# Patient Record
Sex: Female | Born: 1975
Health system: Southern US, Community
[De-identification: ages and names within clinical notes are randomized; demographics above are authoritative.]

## PROBLEM LIST (undated history)

## (undated) ENCOUNTER — Emergency Department (HOSPITAL_COMMUNITY): Payer: BLUE CROSS/BLUE SHIELD | Source: Home / Self Care

## (undated) DIAGNOSIS — F32A Depression, unspecified: Secondary | ICD-10-CM

## (undated) DIAGNOSIS — T7840XA Allergy, unspecified, initial encounter: Secondary | ICD-10-CM

## (undated) DIAGNOSIS — L255 Unspecified contact dermatitis due to plants, except food: Secondary | ICD-10-CM

## (undated) DIAGNOSIS — M25559 Pain in unspecified hip: Secondary | ICD-10-CM

## (undated) DIAGNOSIS — K219 Gastro-esophageal reflux disease without esophagitis: Secondary | ICD-10-CM

## (undated) DIAGNOSIS — F329 Major depressive disorder, single episode, unspecified: Secondary | ICD-10-CM

## (undated) DIAGNOSIS — N949 Unspecified condition associated with female genital organs and menstrual cycle: Secondary | ICD-10-CM

## (undated) DIAGNOSIS — R519 Headache, unspecified: Secondary | ICD-10-CM

## (undated) DIAGNOSIS — M545 Low back pain: Secondary | ICD-10-CM

## (undated) DIAGNOSIS — R51 Headache: Secondary | ICD-10-CM

## (undated) DIAGNOSIS — J45909 Unspecified asthma, uncomplicated: Secondary | ICD-10-CM

## (undated) DIAGNOSIS — N6320 Unspecified lump in the left breast, unspecified quadrant: Secondary | ICD-10-CM

## (undated) HISTORY — DX: Unspecified asthma, uncomplicated: J45.909

## (undated) HISTORY — DX: Depression, unspecified: F32.A

## (undated) HISTORY — PX: BREAST BIOPSY: SHX20

## (undated) HISTORY — DX: Allergy, unspecified, initial encounter: T78.40XA

## (undated) HISTORY — DX: Unspecified contact dermatitis due to plants, except food: L25.5

## (undated) HISTORY — DX: Pain in unspecified hip: M25.559

## (undated) HISTORY — PX: OTHER SURGICAL HISTORY: SHX169

## (undated) HISTORY — PX: WISDOM TOOTH EXTRACTION: SHX21

## (undated) HISTORY — PX: TONSILLECTOMY AND ADENOIDECTOMY: SUR1326

## (undated) HISTORY — DX: Major depressive disorder, single episode, unspecified: F32.9

## (undated) HISTORY — DX: Low back pain: M54.5

## (undated) HISTORY — DX: Unspecified condition associated with female genital organs and menstrual cycle: N94.9

## (undated) HISTORY — PX: ANKLE ARTHROSCOPY W/ INTERNAL FIXATION AND ILIAC CREST BONE GRAFT: SHX1144

---

## 1999-03-14 ENCOUNTER — Other Ambulatory Visit: Admission: RE | Admit: 1999-03-14 | Discharge: 1999-03-14 | Payer: Self-pay | Admitting: Family Medicine

## 1999-05-02 ENCOUNTER — Other Ambulatory Visit: Admission: RE | Admit: 1999-05-02 | Discharge: 1999-05-02 | Payer: Self-pay | Admitting: Family Medicine

## 2001-11-16 ENCOUNTER — Other Ambulatory Visit: Admission: RE | Admit: 2001-11-16 | Discharge: 2001-11-16 | Payer: Self-pay | Admitting: Family Medicine

## 2002-07-26 ENCOUNTER — Encounter: Admission: RE | Admit: 2002-07-26 | Discharge: 2002-07-26 | Payer: Self-pay | Admitting: Family Medicine

## 2002-07-26 ENCOUNTER — Encounter: Payer: Self-pay | Admitting: Family Medicine

## 2006-02-02 ENCOUNTER — Ambulatory Visit: Payer: Self-pay | Admitting: Family Medicine

## 2006-02-09 ENCOUNTER — Encounter: Payer: Self-pay | Admitting: Family Medicine

## 2006-02-09 ENCOUNTER — Ambulatory Visit: Payer: Self-pay | Admitting: Family Medicine

## 2006-02-09 ENCOUNTER — Other Ambulatory Visit: Admission: RE | Admit: 2006-02-09 | Discharge: 2006-02-09 | Payer: Self-pay | Admitting: Family Medicine

## 2006-02-17 ENCOUNTER — Ambulatory Visit: Payer: Self-pay | Admitting: Family Medicine

## 2006-04-27 ENCOUNTER — Ambulatory Visit: Payer: Self-pay | Admitting: Family Medicine

## 2006-06-15 ENCOUNTER — Ambulatory Visit: Payer: Self-pay | Admitting: Family Medicine

## 2007-03-31 DIAGNOSIS — J45909 Unspecified asthma, uncomplicated: Secondary | ICD-10-CM

## 2007-03-31 HISTORY — DX: Unspecified asthma, uncomplicated: J45.909

## 2008-01-13 ENCOUNTER — Ambulatory Visit: Payer: Self-pay | Admitting: Family Medicine

## 2008-01-13 LAB — CONVERTED CEMR LAB
ALT: 15 units/L (ref 0–35)
AST: 17 units/L (ref 0–37)
Albumin: 4.1 g/dL (ref 3.5–5.2)
Alkaline Phosphatase: 47 units/L (ref 39–117)
BUN: 10 mg/dL (ref 6–23)
Basophils Absolute: 0 10*3/uL (ref 0.0–0.1)
Basophils Relative: 0.5 % (ref 0.0–1.0)
Bilirubin Urine: NEGATIVE
Bilirubin, Direct: 0.1 mg/dL (ref 0.0–0.3)
Blood in Urine, dipstick: NEGATIVE
CO2: 25 meq/L (ref 19–32)
Calcium: 9 mg/dL (ref 8.4–10.5)
Chloride: 108 meq/L (ref 96–112)
Cholesterol: 136 mg/dL (ref 0–200)
Creatinine, Ser: 0.8 mg/dL (ref 0.4–1.2)
Eosinophils Absolute: 0.1 10*3/uL (ref 0.0–0.7)
Eosinophils Relative: 2.7 % (ref 0.0–5.0)
GFR calc Af Amer: 106 mL/min
GFR calc non Af Amer: 87 mL/min
Glucose, Bld: 88 mg/dL (ref 70–99)
Glucose, Urine, Semiquant: NEGATIVE
HCT: 39 % (ref 36.0–46.0)
HDL: 52.9 mg/dL (ref >39.0)
Hemoglobin: 13.8 g/dL (ref 12.0–15.0)
Ketones, urine, test strip: NEGATIVE
LDL Cholesterol: 71 mg/dL (ref 0–99)
Lymphocytes Relative: 30.8 % (ref 12.0–46.0)
MCHC: 35.5 g/dL (ref 30.0–36.0)
MCV: 93.7 fL (ref 78.0–100.0)
Monocytes Absolute: 0.4 10*3/uL (ref 0.1–1.0)
Monocytes Relative: 8.1 % (ref 3.0–12.0)
Neutro Abs: 3 10*3/uL (ref 1.4–7.7)
Neutrophils Relative %: 57.9 % (ref 43.0–77.0)
Nitrite: NEGATIVE
Platelets: 221 10*3/uL (ref 150–400)
Potassium: 3.9 meq/L (ref 3.5–5.1)
Protein, U semiquant: NEGATIVE
RBC: 4.16 M/uL (ref 3.87–5.11)
RDW: 12.1 % (ref 11.5–14.6)
Sodium: 140 meq/L (ref 135–145)
Specific Gravity, Urine: 1.02
TSH: 1.07 microintl units/mL (ref 0.35–5.50)
Total Bilirubin: 1.2 mg/dL (ref 0.3–1.2)
Total CHOL/HDL Ratio: 2.6
Total Protein: 6.6 g/dL (ref 6.0–8.3)
Triglycerides: 59 mg/dL (ref 0–149)
Urobilinogen, UA: 0.2
VLDL: 12 mg/dL (ref 0–40)
WBC Urine, dipstick: NEGATIVE
WBC: 5 10*3/uL (ref 4.5–10.5)
pH: 7

## 2008-01-20 ENCOUNTER — Other Ambulatory Visit: Admission: RE | Admit: 2008-01-20 | Discharge: 2008-01-20 | Payer: Self-pay | Admitting: Family Medicine

## 2008-01-20 ENCOUNTER — Ambulatory Visit: Payer: Self-pay | Admitting: Family Medicine

## 2008-01-20 ENCOUNTER — Encounter: Payer: Self-pay | Admitting: Family Medicine

## 2008-04-17 DIAGNOSIS — M545 Low back pain, unspecified: Secondary | ICD-10-CM | POA: Insufficient documentation

## 2008-04-17 HISTORY — DX: Low back pain, unspecified: M54.50

## 2008-04-18 ENCOUNTER — Ambulatory Visit: Payer: Self-pay | Admitting: Family Medicine

## 2008-05-23 ENCOUNTER — Ambulatory Visit: Payer: Self-pay | Admitting: Family Medicine

## 2008-05-25 ENCOUNTER — Ambulatory Visit: Payer: Self-pay | Admitting: Family Medicine

## 2009-01-19 ENCOUNTER — Ambulatory Visit: Payer: Self-pay | Admitting: Family Medicine

## 2009-01-19 LAB — CONVERTED CEMR LAB
ALT: 22 units/L (ref 0–35)
AST: 23 units/L (ref 0–37)
Albumin: 4.1 g/dL (ref 3.5–5.2)
Alkaline Phosphatase: 40 units/L (ref 39–117)
BUN: 10 mg/dL (ref 6–23)
Basophils Absolute: 0 10*3/uL (ref 0.0–0.1)
Basophils Relative: 0 % (ref 0.0–3.0)
Bilirubin Urine: NEGATIVE
Bilirubin, Direct: 0 mg/dL (ref 0.0–0.3)
Blood in Urine, dipstick: NEGATIVE
CO2: 28 meq/L (ref 19–32)
Calcium: 8.9 mg/dL (ref 8.4–10.5)
Chloride: 111 meq/L (ref 96–112)
Cholesterol: 134 mg/dL (ref 0–200)
Creatinine, Ser: 0.8 mg/dL (ref 0.4–1.2)
Eosinophils Absolute: 0.1 10*3/uL (ref 0.0–0.7)
Eosinophils Relative: 1.6 % (ref 0.0–5.0)
GFR calc non Af Amer: 86.54 mL/min (ref >60)
Glucose, Bld: 88 mg/dL (ref 70–99)
Glucose, Urine, Semiquant: NEGATIVE
HCT: 40 % (ref 36.0–46.0)
HDL: 48.1 mg/dL (ref >39.00)
Hemoglobin: 13.9 g/dL (ref 12.0–15.0)
Ketones, urine, test strip: NEGATIVE
LDL Cholesterol: 73 mg/dL (ref 0–99)
Lymphocytes Relative: 29.9 % (ref 12.0–46.0)
Lymphs Abs: 1.4 10*3/uL (ref 0.7–4.0)
MCHC: 34.7 g/dL (ref 30.0–36.0)
MCV: 92.9 fL (ref 78.0–100.0)
Monocytes Absolute: 0.3 10*3/uL (ref 0.1–1.0)
Monocytes Relative: 6.8 % (ref 3.0–12.0)
Neutro Abs: 2.9 10*3/uL (ref 1.4–7.7)
Neutrophils Relative %: 61.7 % (ref 43.0–77.0)
Nitrite: NEGATIVE
Platelets: 212 10*3/uL (ref 150.0–400.0)
Potassium: 4 meq/L (ref 3.5–5.1)
Protein, U semiquant: NEGATIVE
RBC: 4.31 M/uL (ref 3.87–5.11)
RDW: 11.9 % (ref 11.5–14.6)
Sodium: 139 meq/L (ref 135–145)
Specific Gravity, Urine: 1.02
TSH: 0.93 microintl units/mL (ref 0.35–5.50)
Total Bilirubin: 1.2 mg/dL (ref 0.3–1.2)
Total CHOL/HDL Ratio: 3
Total Protein: 6.8 g/dL (ref 6.0–8.3)
Triglycerides: 67 mg/dL (ref 0.0–149.0)
Urobilinogen, UA: 0.2
VLDL: 13.4 mg/dL (ref 0.0–40.0)
WBC Urine, dipstick: NEGATIVE
WBC: 4.7 10*3/uL (ref 4.5–10.5)
pH: 6

## 2009-01-22 ENCOUNTER — Ambulatory Visit: Payer: Self-pay | Admitting: Family Medicine

## 2009-01-22 ENCOUNTER — Other Ambulatory Visit: Admission: RE | Admit: 2009-01-22 | Discharge: 2009-01-22 | Payer: Self-pay | Admitting: Family Medicine

## 2009-01-22 ENCOUNTER — Encounter: Payer: Self-pay | Admitting: Family Medicine

## 2009-01-22 DIAGNOSIS — M25559 Pain in unspecified hip: Secondary | ICD-10-CM

## 2009-01-22 HISTORY — DX: Pain in unspecified hip: M25.559

## 2009-08-25 LAB — HM PAP SMEAR: HM Pap smear: NORMAL

## 2009-09-04 ENCOUNTER — Ambulatory Visit: Payer: Self-pay | Admitting: Family Medicine

## 2009-09-05 ENCOUNTER — Telehealth: Payer: Self-pay | Admitting: Family Medicine

## 2009-09-06 ENCOUNTER — Ambulatory Visit: Payer: Self-pay | Admitting: Family Medicine

## 2009-09-07 ENCOUNTER — Ambulatory Visit: Payer: Self-pay | Admitting: Family Medicine

## 2009-12-03 DIAGNOSIS — L255 Unspecified contact dermatitis due to plants, except food: Secondary | ICD-10-CM | POA: Insufficient documentation

## 2009-12-03 HISTORY — DX: Unspecified contact dermatitis due to plants, except food: L25.5

## 2009-12-10 ENCOUNTER — Ambulatory Visit: Payer: Self-pay | Admitting: Family Medicine

## 2010-01-21 ENCOUNTER — Ambulatory Visit: Payer: Self-pay | Admitting: Family Medicine

## 2010-01-21 LAB — CONVERTED CEMR LAB
ALT: 16 units/L (ref 0–35)
AST: 21 units/L (ref 0–37)
Albumin: 4.1 g/dL (ref 3.5–5.2)
Alkaline Phosphatase: 49 units/L (ref 39–117)
BUN: 14 mg/dL (ref 6–23)
Basophils Absolute: 0 10*3/uL (ref 0.0–0.1)
Basophils Relative: 0.8 % (ref 0.0–3.0)
Bilirubin Urine: NEGATIVE
Bilirubin, Direct: 0.1 mg/dL (ref 0.0–0.3)
CO2: 28 meq/L (ref 19–32)
Calcium: 9.3 mg/dL (ref 8.4–10.5)
Chloride: 112 meq/L (ref 96–112)
Cholesterol: 151 mg/dL (ref 0–200)
Creatinine, Ser: 0.8 mg/dL (ref 0.4–1.2)
Eosinophils Absolute: 0.1 10*3/uL (ref 0.0–0.7)
Eosinophils Relative: 2.2 % (ref 0.0–5.0)
GFR calc non Af Amer: 89.93 mL/min (ref >60)
Glucose, Bld: 94 mg/dL (ref 70–99)
Glucose, Urine, Semiquant: NEGATIVE
HCT: 38.1 % (ref 36.0–46.0)
HDL: 49.6 mg/dL (ref >39.00)
Hemoglobin: 13.5 g/dL (ref 12.0–15.0)
Ketones, urine, test strip: NEGATIVE
LDL Cholesterol: 89 mg/dL (ref 0–99)
Lymphocytes Relative: 33.9 % (ref 12.0–46.0)
Lymphs Abs: 1.5 10*3/uL (ref 0.7–4.0)
MCHC: 35.4 g/dL (ref 30.0–36.0)
MCV: 91.6 fL (ref 78.0–100.0)
Monocytes Absolute: 0.3 10*3/uL (ref 0.1–1.0)
Monocytes Relative: 6.9 % (ref 3.0–12.0)
Neutro Abs: 2.5 10*3/uL (ref 1.4–7.7)
Neutrophils Relative %: 56.2 % (ref 43.0–77.0)
Nitrite: NEGATIVE
Platelets: 226 10*3/uL (ref 150.0–400.0)
Potassium: 5 meq/L (ref 3.5–5.1)
Protein, U semiquant: NEGATIVE
RBC: 4.16 M/uL (ref 3.87–5.11)
RDW: 12.5 % (ref 11.5–14.6)
Sodium: 145 meq/L (ref 135–145)
Specific Gravity, Urine: >=1.03
TSH: 0.73 microintl units/mL (ref 0.35–5.50)
Total Bilirubin: 0.7 mg/dL (ref 0.3–1.2)
Total CHOL/HDL Ratio: 3
Total Protein: 6.1 g/dL (ref 6.0–8.3)
Triglycerides: 63 mg/dL (ref 0.0–149.0)
Urobilinogen, UA: 0.2
VLDL: 12.6 mg/dL (ref 0.0–40.0)
WBC Urine, dipstick: NEGATIVE
WBC: 4.4 10*3/uL — ABNORMAL LOW (ref 4.5–10.5)
pH: 5.5

## 2010-01-24 ENCOUNTER — Ambulatory Visit: Payer: Self-pay | Admitting: Family Medicine

## 2010-01-24 ENCOUNTER — Other Ambulatory Visit: Admission: RE | Admit: 2010-01-24 | Discharge: 2010-01-24 | Payer: Self-pay | Admitting: Family Medicine

## 2010-01-24 DIAGNOSIS — N949 Unspecified condition associated with female genital organs and menstrual cycle: Secondary | ICD-10-CM

## 2010-01-24 DIAGNOSIS — N925 Other specified irregular menstruation: Secondary | ICD-10-CM

## 2010-01-24 DIAGNOSIS — N938 Other specified abnormal uterine and vaginal bleeding: Secondary | ICD-10-CM

## 2010-01-24 HISTORY — DX: Other specified irregular menstruation: N92.5

## 2010-01-24 HISTORY — DX: Other specified abnormal uterine and vaginal bleeding: N93.8

## 2010-01-24 LAB — CONVERTED CEMR LAB: Pap Smear: NEGATIVE

## 2010-09-10 NOTE — Assessment & Plan Note (Signed)
Summary: poison ivy//ccm   Vital Signs:  Patient profile:   35 year old female Weight:      178 pounds BMI:     29.73 Temp:     97.9 degrees F oral BP sitting:   120 / 80  (left arm)  Vitals Entered By: Kern Reap CMA Duncan Dull) (Dec 10, 2009 12:35 PM) CC: poison ivy   CC:  poison ivy.  History of Present Illness: Brandi Sutton is a 35 year old, married female, nonsmoker, who comes in today for evaluation of contact dermatitis x 1 week.  She developed the symptoms on the right are week ago, now spread to her left arm and down to her legs.  Review of systems otherwise negative  Allergies: 1)  ! Pcn 2)  ! Wellbutrin  Past History:  Past medical, surgical, family and social histories (including risk factors) reviewed for relevance to current acute and chronic problems.  Past Medical History: Reviewed history from 05/25/2008 and no changes required. Asthma dysplastic nevi low back pain  Past Surgical History: Reviewed history from 03/31/2007 and no changes required. T/A Shoulder Surgery  Family History: Reviewed history from 03/31/2007 and no changes required. Family History Depression Family History Diabetes 1st degree relative Family History Hypertension Family History Lung cancer Family History of Colon CA 1st degree relative <60 Family History of Stroke M 1st degree relative <50  Social History: Reviewed history from 01/20/2008 and no changes required.  Occupation: Married Former Smoker Alcohol use-no Drug use-no Regular exercise-yes  Review of Systems      See HPI  Physical Exam  General:  Well-developed,well-nourished,in no acute distress; alert,appropriate and cooperative throughout examination Skin:  fairly extensive contact dermatitis involving both arms, and both legs   Problems:  Medical Problems Added: 1)  Dx of Contact Dermatitis&other Eczema Due To Plants  (ICD-692.6)  Impression & Recommendations:  Problem # 1:  CONTACT DERMATITIS&OTHER  ECZEMA DUE TO PLANTS (ICD-692.6) Assessment New  Her updated medication list for this problem includes:    Prednisone 20 Mg Tabs (Prednisone) ..... Uad  Complete Medication List: 1)  Flexeril 10 Mg Tabs (Cyclobenzaprine hcl) .... Take 1 tablet by mouth three times a day 2)  Vicodin Es 7.5-750 Mg Tabs (Hydrocodone-acetaminophen) .... Take 1 tablet by mouth three times a day 3)  Prednisone 20 Mg Tabs (Prednisone) .... Uad  Patient Instructions: 1)  begin prednisone two tabs now then two tabs daily x 3 days, one x 3 days, a half x 3 days, then half a tablet Monday, Wednesday, Friday, for a two-week taper.  Return p.r.n. Prescriptions: PREDNISONE 20 MG TABS (PREDNISONE) UAD  #40 x 1   Entered and Authorized by:   Roderick Pee MD   Signed by:   Roderick Pee MD on 12/10/2009   Method used:   [facility]        [facility]  [address] [facility]       [address]       [place], [place]  [address]       Ph: [phone] or [phone]       Fax: [phone]   RxID:   [id number]

## 2010-09-10 NOTE — Assessment & Plan Note (Signed)
Summary: cpx/pap/njr   Vital Signs:  Patient profile:   35 year old female Menstrual status:  irregular LMP:     01/12/2010 Height:      65 inches Weight:      176 pounds BMI:     29.39 Temp:     97.6 degrees F oral BP sitting:   118 / 80  (left arm) Cuff size:   regular  Vitals Entered By: Kern Reap CMA Duncan Dull) (January 24, 2010 10:13 AM) CC: cpx Is Patient Diabetic? No Pain Assessment Patient in pain? no      LMP (date): 01/12/2010     Menstrual Status irregular Enter LMP: 01/12/2010 Last PAP Result NEGATIVE FOR INTRAEPITHELIAL LESIONS OR MALIGNANCY.   CC:  cpx.  History of Present Illness: Brandi Sutton is a delightful 35 year old, married female, nonsmoker, G two, P2, who comes in for general medical examination,  She's always been in excellent, health.  She's had no chronic health problems.  She's having dysfunctional uterine bleeding.  It started back in 06-28-23 when her father-in-law died.  It's not gotten a better.  Her periods tend last around 10 to 12 days.  She has 3 days of flooding.  Review of systems negative  Allergies: 1)  ! Pcn 2)  ! Wellbutrin  Past History:  Past medical, surgical, family and social histories (including risk factors) reviewed, and no changes noted (except as noted below).  Past Medical History: Reviewed history from 05/25/2008 and no changes required. Asthma dysplastic nevi low back pain  Past Surgical History: Reviewed history from 03/31/2007 and no changes required. T/A Shoulder Surgery  Family History: Reviewed history from 03/31/2007 and no changes required. Family History Depression Family History Diabetes 1st degree relative Family History Hypertension Family History Lung cancer Family History of Colon CA 1st degree relative <60 Family History of Stroke M 1st degree relative <50  Social History: Reviewed history from 01/20/2008 and no changes required.  Occupation: Married Former Smoker Alcohol  use-no Drug use-no Regular exercise-yes  Review of Systems      See HPI  Physical Exam  General:  Well-developed,well-nourished,in no acute distress; alert,appropriate and cooperative throughout examination Head:  Normocephalic and atraumatic without obvious abnormalities. No apparent alopecia or balding. Eyes:  No corneal or conjunctival inflammation noted. EOMI. Perrla. Funduscopic exam benign, without hemorrhages, exudates or papilledema. Vision grossly normal. Ears:  External ear exam shows no significant lesions or deformities.  Otoscopic examination reveals clear canals, tympanic membranes are intact bilaterally without bulging, retraction, inflammation or discharge. Hearing is grossly normal bilaterally. Nose:  External nasal examination shows no deformity or inflammation. Nasal mucosa are pink and moist without lesions or exudates. Neck:  No deformities, masses, or tenderness noted. Chest Wall:  No deformities, masses, or tenderness noted. Breasts:  No mass, nodules, thickening, tenderness, bulging, retraction, inflamation, nipple discharge or skin changes noted.   Lungs:  Normal respiratory effort, chest expands symmetrically. Lungs are clear to auscultation, no crackles or wheezes. Heart:  Normal rate and regular rhythm. S1 and S2 normal without gallop, murmur, click, rub or other extra sounds. Abdomen:  Bowel sounds positive,abdomen soft and non-tender without masses, organomegaly or hernias noted. Genitalia:  Pelvic Exam:        External: normal female genitalia without lesions or masses        Vagina: normal without lesions or masses        Cervix: normal without lesions or masses        Adnexa: normal bimanual exam without masses  or fullness        Uterus: normal by palpation        Pap smear: performed Msk:  No deformity or scoliosis noted of thoracic or lumbar spine.   Pulses:  R and L carotid,radial,femoral,dorsalis pedis and posterior tibial pulses are full and equal  bilaterally Extremities:  No clubbing, cyanosis, edema, or deformity noted with normal full range of motion of all joints.   Neurologic:  No cranial nerve deficits noted. Station and gait are normal. Plantar reflexes are down-going bilaterally. DTRs are symmetrical throughout. Sensory, motor and coordinative functions appear intact. Skin:  Intact without suspicious lesions or rashes Cervical Nodes:  No lymphadenopathy noted Axillary Nodes:  No palpable lymphadenopathy Inguinal Nodes:  No significant adenopathy Psych:  Cognition and judgment appear intact. Alert and cooperative with normal attention span and concentration. No apparent delusions, illusions, hallucinations   Impression & Recommendations:  Problem # 1:  PHYSICAL EXAMINATION (ICD-V70.0) Assessment Unchanged  Problem # 2:  DYSFUNCTIONAL UTERINE BLEEDING (ICD-626.8) Assessment: New  Complete Medication List: 1)  Seasonale 0.15-0.03 Mg Tabs (Levonorgest-eth estrad 91-day) .... Uad  Patient Instructions: 1)  begin Seasonale............ one nightly x 3 months, then stop and see how your periods do,  if you have a problem  Call me.............. if not will see u  next June 2)  take an 81 mg, baby aspirin with the BCPs Prescriptions: SEASONALE 0.15-0.03 MG TABS (LEVONORGEST-ETH ESTRAD 91-DAY) UAD  #1 pak x 3   Entered and Authorized by:   Roderick Pee MD   Signed by:   Roderick Pee MD on 01/24/2010   Method used:   [facility]  [phone]       [address]       [place], [place]  [id number]       Ph: [phone] or [phone]       Fax: [phone]   RxID:   [id number]

## 2010-09-10 NOTE — Assessment & Plan Note (Signed)
Summary: SEVERE LOWER BACK PAIN/HIP AND GROIN PAIN/CJR   Vital Signs:  Patient profile:   35 year old female Weight:      176 pounds Temp:     97.7 degrees F oral BP sitting:   116 / 76  (left arm) Cuff size:   regular  Vitals Entered By: Kern Reap CMA Duncan Dull) (September 04, 2009 12:14 PM)  Reason for Visit back pain  History of Present Illness: Brandi Sutton is a 35 year old, married female, nonsmoker, who comes in today for evaluation of back pain.  She states she did not do anything unusual.  Over the weekend.  On Monday morning.  She awoke with right lumbar back pain.  She went to work, but by noon the pain was intolerable therefore, she went home and stated bedrest.  It was better.  This morning, so she went to work at noon today.  It was again intolerable.  Her pain now as a 6 on a scale of one to 10.  She states the pain is, constant, it's mostly dull, however, occasionally it will be sharp.  She points to the right lumbar area as the source of her pain.  It radiates down to the buttocks, but no further.  This is her third episode.  The last episode was about a year ago.  He was treated with bed rest, anti-inflammatories, and physical therapy, and she improved after a couple weeks.  Review of systems negative.  LMP just ended birth control husband has had vasectomy  Allergies: 1)  ! Pcn 2)  ! Wellbutrin  Past History:  Past medical, surgical, family and social histories (including risk factors) reviewed for relevance to current acute and chronic problems.  Past Medical History: Reviewed history from 05/25/2008 and no changes required. Asthma dysplastic nevi low back pain  Past Surgical History: Reviewed history from 03/31/2007 and no changes required. T/A Shoulder Surgery  Family History: Reviewed history from 03/31/2007 and no changes required. Family History Depression Family History Diabetes 1st degree relative Family History Hypertension Family History Lung  cancer Family History of Colon CA 1st degree relative <60 Family History of Stroke M 1st degree relative <50  Social History: Reviewed history from 01/20/2008 and no changes required.  Occupation: Married Former Smoker Alcohol use-no Drug use-no Regular exercise-yes  Review of Systems      See HPI  Physical Exam  General:  Well-developed,well-nourished,in no acute distress; alert,appropriate and cooperative throughout examination Msk:  No deformity or scoliosis noted of thoracic or lumbar spine.   Pulses:  R and L carotid,radial,femoral,dorsalis pedis and posterior tibial pulses are full and equal bilaterally Extremities:  No clubbing, cyanosis, edema, or deformity noted with normal full range of motion of all joints.   Neurologic:  No cranial nerve deficits noted. Station and gait are normal. Plantar reflexes are down-going bilaterally. DTRs are symmetrical throughout. Sensory, motor and coordinative functions appear intact.   Impression & Recommendations:  Problem # 1:  LOW BACK PAIN, ACUTE (ICD-724.2) Assessment Deteriorated  Her updated medication list for this problem includes:    Flexeril 10 Mg Tabs (Cyclobenzaprine hcl) .Marland Kitchen... Take 1 tablet by mouth three times a day    Vicodin Es 7.5-750 Mg Tabs (Hydrocodone-acetaminophen) .Marland Kitchen... Take 1 tablet by mouth three times a day  Complete Medication List: 1)  Flexeril 10 Mg Tabs (Cyclobenzaprine hcl) .... Take 1 tablet by mouth three times a day 2)  Vicodin Es 7.5-750 Mg Tabs (Hydrocodone-acetaminophen) .... Take 1 tablet by mouth three times a  day  Other Orders: T-Lumbar Spine w/Flex & Ext 4 Views ([id number])  Patient Instructions: 1)  begin Motrin 600 mg 3 times a day with food.  Also, Flexeril, and Vicodin, one of each 3 times a day for pain.  State, complete bedrest at home today and tomorrow.  Thursday ambulate walk lie down or lie down due to stretching return Friday afternoon for follow-up Prescriptions: VICODIN ES  7.5-750 MG TABS (HYDROCODONE-ACETAMINOPHEN) Take 1 tablet by mouth three times a day  #50 x 1   Entered and Authorized by:   Roderick Pee MD   Signed by:   Roderick Pee MD on 09/04/2009   Method used:   Print then Give to Patient   RxID:   [id number] FLEXERIL 10 MG TABS (CYCLOBENZAPRINE HCL) Take 1 tablet by mouth three times a day  #50 x 1   Entered and Authorized by:   Roderick Pee MD   Signed by:   Roderick Pee MD on 09/04/2009   Method used:   Print then Give to Patient   RxID:   [id number]

## 2010-09-10 NOTE — Assessment & Plan Note (Signed)
Summary: FUP PER DR TODD//CCM   Vital Signs:  Patient profile:   35 year old female Weight:      176 pounds Temp:     98.0 degrees F oral BP sitting:   120 / 80  (left arm) Cuff size:   regular  Vitals Entered By: Kern Reap CMA Duncan Dull) (September 07, 2009 1:56 PM)  Reason for Visit follow up back  History of Present Illness: Brandi Sutton is a 35 year old female, who comes in today for reevaluation of low back pain.  She's been on Aleve b.i.d. stretching exercises Flexeril and Vicodin nightly.  Plain films of her spine were normal.  Family history is pertinent in her mother has degenerative disk disease has had 3 lumbar surgical procedures and now has effusion with chronic pain.  She wonders if it can be a genetic problem.  Today, her pain is a two on a scale of one to 10.  Again, no radiation.  No neurologic symptoms.  She's only taken half of a Vicodin nightly p.r.n.  She's not taking the Flexeril.  She is taking the Aleve and doing the stretching exercises  Allergies: 1)  ! Pcn 2)  ! Wellbutrin  Past History:  Past medical, surgical, family and social histories (including risk factors) reviewed for relevance to current acute and chronic problems.  Past Medical History: Reviewed history from 05/25/2008 and no changes required. Asthma dysplastic nevi low back pain  Past Surgical History: Reviewed history from 03/31/2007 and no changes required. T/A Shoulder Surgery  Family History: Reviewed history from 03/31/2007 and no changes required. Family History Depression Family History Diabetes 1st degree relative Family History Hypertension Family History Lung cancer Family History of Colon CA 1st degree relative <60 Family History of Stroke M 1st degree relative <50  Social History: Reviewed history from 01/20/2008 and no changes required.  Occupation: Married Former Smoker Alcohol use-no Drug use-no Regular exercise-yes  Review of Systems      See  HPI  Physical Exam  General:  Well-developed,well-nourished,in no acute distress; alert,appropriate and cooperative throughout examination Msk:  No deformity or scoliosis noted of thoracic or lumbar spine.   Pulses:  R and L carotid,radial,femoral,dorsalis pedis and posterior tibial pulses are full and equal bilaterally Extremities:  No clubbing, cyanosis, edema, or deformity noted with normal full range of motion of all joints.   Neurologic:  No cranial nerve deficits noted. Station and gait are normal. Plantar reflexes are down-going bilaterally. DTRs are symmetrical throughout. Sensory, motor and coordinative functions appear intact.   Impression & Recommendations:  Problem # 1:  LOW BACK PAIN, ACUTE (ICD-724.2) Assessment Improved  Her updated medication list for this problem includes:    Flexeril 10 Mg Tabs (Cyclobenzaprine hcl) .Marland Kitchen... Take 1 tablet by mouth three times a day    Vicodin Es 7.5-750 Mg Tabs (Hydrocodone-acetaminophen) .Marland Kitchen... Take 1 tablet by mouth three times a day  Complete Medication List: 1)  Flexeril 10 Mg Tabs (Cyclobenzaprine hcl) .... Take 1 tablet by mouth three times a day 2)  Vicodin Es 7.5-750 Mg Tabs (Hydrocodone-acetaminophen) .... Take 1 tablet by mouth three times a day  Patient Instructions: 1)  continued conservative therapy.  If you don't see any improvement in the next week to 10 days or your symptoms get worse we would consider further consultation with neurosurgery

## 2010-09-10 NOTE — Letter (Signed)
Summary: Accidental Injury Claim Form  Accidental Injury Claim Form   Imported By: Maryln Gottron 09/26/2009 13:05:15  _____________________________________________________________________  External Attachment:    Type:   Image     Comment:   External Document

## 2010-09-10 NOTE — Progress Notes (Signed)
Summary: Pt req cpx dates from 01-13-2003 to Jan 12, 2005. Also dx codes from yesterday  Phone Note Call from Patient Call back at Home Phone [phone] Call back at [phone] cell   Caller: Patient Summary of Call: Pt called and is req dates for cpx from January 13, 2003 to 01-12-05.   Pt needs this for insurance purposes. Pt also need the dx codes from her ov yesterday re: back injury.  Initial call taken by: Lucy Antigua,  September 05, 2009 3:40 PM  Follow-up for Phone Call        ok  Follow-up by: Roderick Pee MD,  September 06, 2009 8:25 AM  Additional Follow-up for Phone Call Additional follow up Details #1::        form faxed Additional Follow-up by: Alfred Levins, CMA,  September 06, 2009 5:04 PM

## 2011-03-20 ENCOUNTER — Other Ambulatory Visit (INDEPENDENT_AMBULATORY_CARE_PROVIDER_SITE_OTHER): Payer: BC Managed Care – PPO

## 2011-03-20 DIAGNOSIS — Z0000 Encounter for general adult medical examination without abnormal findings: Secondary | ICD-10-CM

## 2011-03-20 LAB — HEPATIC FUNCTION PANEL
ALT: 28 U/L (ref 0–35)
AST: 28 U/L (ref 0–37)
Bilirubin, Direct: 0 mg/dL (ref 0.0–0.3)
Total Bilirubin: 0.5 mg/dL (ref 0.3–1.2)

## 2011-03-20 LAB — BASIC METABOLIC PANEL
BUN: 10 mg/dL (ref 6–23)
Calcium: 9.2 mg/dL (ref 8.4–10.5)
Creatinine, Ser: 1 mg/dL (ref 0.4–1.2)
GFR: 63.17 mL/min (ref >60.00)

## 2011-03-20 LAB — LIPID PANEL
LDL Cholesterol: 94 mg/dL (ref 0–99)
Total CHOL/HDL Ratio: 3
Triglycerides: 123 mg/dL (ref 0.0–149.0)
VLDL: 24.6 mg/dL (ref 0.0–40.0)

## 2011-03-20 LAB — POCT URINALYSIS DIPSTICK
Blood, UA: NEGATIVE
Glucose, UA: NEGATIVE
Nitrite, UA: NEGATIVE
Protein, UA: NEGATIVE
Urobilinogen, UA: 0.2

## 2011-03-20 LAB — CBC WITH DIFFERENTIAL/PLATELET
Eosinophils Relative: 2.9 % (ref 0.0–5.0)
Monocytes Relative: 6 % (ref 3.0–12.0)
Neutrophils Relative %: 61.6 % (ref 43.0–77.0)
Platelets: 263 10*3/uL (ref 150.0–400.0)
WBC: 5.2 10*3/uL (ref 4.5–10.5)

## 2011-03-26 ENCOUNTER — Encounter: Payer: Self-pay | Admitting: Family Medicine

## 2011-03-26 ENCOUNTER — Encounter: Payer: Self-pay | Admitting: *Deleted

## 2011-03-27 ENCOUNTER — Ambulatory Visit (INDEPENDENT_AMBULATORY_CARE_PROVIDER_SITE_OTHER): Payer: BC Managed Care – PPO | Admitting: Family Medicine

## 2011-03-27 ENCOUNTER — Encounter: Payer: Self-pay | Admitting: Family Medicine

## 2011-03-27 ENCOUNTER — Other Ambulatory Visit (HOSPITAL_COMMUNITY)
Admission: RE | Admit: 2011-03-27 | Discharge: 2011-03-27 | Disposition: A | Discharge status: Home / Self Care | Payer: BC Managed Care – PPO | Source: Ambulatory Visit | Attending: Family Medicine | Admitting: Family Medicine

## 2011-03-27 DIAGNOSIS — N949 Unspecified condition associated with female genital organs and menstrual cycle: Secondary | ICD-10-CM

## 2011-03-27 DIAGNOSIS — Z01419 Encounter for gynecological examination (general) (routine) without abnormal findings: Secondary | ICD-10-CM | POA: Insufficient documentation

## 2011-03-27 DIAGNOSIS — Z0000 Encounter for general adult medical examination without abnormal findings: Secondary | ICD-10-CM

## 2011-03-27 NOTE — Progress Notes (Signed)
  Subjective:    Patient ID: Brandi Sutton, female    DOB: 15-Apr-1976, 35 y.o.   MRN: [id number]  HPI [name] is a delightful 35 year old, married female, G1, P1,,,,,,, husband has had a vasectomy,,,,,,,, who comes in today for general medical examination,  She's always been in excellent, health.  She's had no chronic health problems except for some dysfunction uterine bleeding.  Will put on birth control pills for that and that seemed to help.  She is content to stay off of them for now.  She gets routine eye care, dental care, does not check her breasts monthly, and declines a screening mammogram.  At this point.  She would like to wait, to she's 38 because she is very tender breasts.  Both the mother and father had melanoma and we would do a complete head to toe.  Skin exam.   Review of Systems  Constitutional: Negative.   HENT: Negative.   Eyes: Negative.   Respiratory: Negative.   Cardiovascular: Negative.   Gastrointestinal: Negative.   Genitourinary: Negative.   Musculoskeletal: Negative.   Neurological: Negative.   Hematological: Negative.   Psychiatric/Behavioral: Negative.        Objective:   Physical Exam  Constitutional: She appears well-developed and well-nourished.  HENT:  Head: Normocephalic and atraumatic.  Right Ear: External ear normal.  Left Ear: External ear normal.  Nose: Nose normal.  Mouth/Throat: Oropharynx is clear and moist.  Eyes: EOM are normal. Pupils are equal, round, and reactive to light.  Neck: Normal range of motion. Neck supple. No thyromegaly present.  Cardiovascular: Normal rate, regular rhythm, normal heart sounds and intact distal pulses.  Exam reveals no gallop and no friction rub.   No murmur heard. Pulmonary/Chest: Effort normal and breath sounds normal.  Abdominal: Soft. Bowel sounds are normal. She exhibits no distension and no mass. There is no tenderness. There is no rebound.  Genitourinary: Vagina normal and uterus normal.  Guaiac negative stool. No vaginal discharge found.       Total body skin exam no abnormal appearing lesion  Musculoskeletal: Normal range of motion.  Lymphadenopathy:    She has no cervical adenopathy.  Neurological: She is alert. She has normal reflexes. No cranial nerve deficit. She exhibits normal muscle tone. Coordination normal.  Skin: Skin is warm and dry.  Psychiatric: She has a normal mood and affect. Her behavior is normal. Judgment and thought content normal.          Assessment & Plan:  Healthy female.  Dysfunction uterine bleeding, currently asymptomatic.  Yearly follow-up monthly.  Skin exam and breast exam

## 2011-03-27 NOTE — Patient Instructions (Signed)
Do a thorough breast and skin exam monthly.  Return in one year, sooner if any problems.  Remembered where your sunscreens SPF 50

## 2011-04-28 ENCOUNTER — Other Ambulatory Visit: Payer: Self-pay | Admitting: Family Medicine

## 2011-04-28 MED ORDER — CYCLOBENZAPRINE HCL 5 MG PO TABS: 5.0000 mg | ORAL_TABLET | Freq: Three times a day (TID) | ORAL | Status: DC | PRN

## 2011-04-28 MED ORDER — HYDROCODONE-ACETAMINOPHEN 7.5-750 MG PO TABS: 1.0000 | ORAL_TABLET | Freq: Four times a day (QID) | ORAL | Status: AC | PRN

## 2011-04-28 NOTE — Telephone Encounter (Signed)
Pt is having back pain requesting refill on vicodin call into [facility] [address] [phone]. Pt decline ov. Pt was last seen on 03-2011

## 2011-04-28 NOTE — Telephone Encounter (Signed)
rx called/sent and patient is aware

## 2011-04-28 NOTE — Telephone Encounter (Signed)
Flexeril 5 mg, number 30 directions one half to one tab at bedtime, refills x 1.  Vicodin ES, number 30 directions one half to one tab nightly p.r.n. Pain, refills x 1.  Motrin 600 mg twice daily with food.  If pain persists longer than a week or she has any neurologic symptoms, numbness, weakness, etc., then we need to see her in the office

## 2012-04-16 ENCOUNTER — Other Ambulatory Visit (INDEPENDENT_AMBULATORY_CARE_PROVIDER_SITE_OTHER): Payer: BC Managed Care – PPO

## 2012-04-16 DIAGNOSIS — Z0000 Encounter for general adult medical examination without abnormal findings: Secondary | ICD-10-CM

## 2012-04-16 LAB — POCT URINALYSIS DIPSTICK
Bilirubin, UA: NEGATIVE
Nitrite, UA: NEGATIVE
Protein, UA: NEGATIVE
pH, UA: 6

## 2012-04-16 LAB — CBC WITH DIFFERENTIAL/PLATELET
Basophils Relative: 0.6 % (ref 0.0–3.0)
Eosinophils Relative: 3.6 % (ref 0.0–5.0)
HCT: 41.1 % (ref 36.0–46.0)
Hemoglobin: 13.8 g/dL (ref 12.0–15.0)
Lymphs Abs: 1.3 10*3/uL (ref 0.7–4.0)
MCV: 92.3 fl (ref 78.0–100.0)
Monocytes Absolute: 0.3 10*3/uL (ref 0.1–1.0)
Monocytes Relative: 7 % (ref 3.0–12.0)
Neutro Abs: 2.7 10*3/uL (ref 1.4–7.7)
RBC: 4.45 Mil/uL (ref 3.87–5.11)
WBC: 4.4 10*3/uL — ABNORMAL LOW (ref 4.5–10.5)

## 2012-04-16 LAB — BASIC METABOLIC PANEL
BUN: 8 mg/dL (ref 6–23)
Chloride: 110 mEq/L (ref 96–112)
GFR: 85.02 mL/min (ref >60.00)
Potassium: 4.8 mEq/L (ref 3.5–5.1)
Sodium: 143 mEq/L (ref 135–145)

## 2012-04-16 LAB — HEPATIC FUNCTION PANEL
ALT: 64 U/L — ABNORMAL HIGH (ref 0–35)
AST: 39 U/L — ABNORMAL HIGH (ref 0–37)
Total Bilirubin: 0.4 mg/dL (ref 0.3–1.2)
Total Protein: 6.8 g/dL (ref 6.0–8.3)

## 2012-04-16 LAB — TSH: TSH: 0.93 u[IU]/mL (ref 0.35–5.50)

## 2012-04-16 LAB — LIPID PANEL
HDL: 47 mg/dL (ref >39.00)
Total CHOL/HDL Ratio: 3
VLDL: 23.2 mg/dL (ref 0.0–40.0)

## 2012-04-26 ENCOUNTER — Encounter: Payer: Self-pay | Admitting: Family Medicine

## 2012-04-26 ENCOUNTER — Ambulatory Visit (INDEPENDENT_AMBULATORY_CARE_PROVIDER_SITE_OTHER): Payer: BC Managed Care – PPO | Admitting: Family Medicine

## 2012-04-26 ENCOUNTER — Other Ambulatory Visit (HOSPITAL_COMMUNITY)
Admission: RE | Admit: 2012-04-26 | Discharge: 2012-04-26 | Disposition: A | Discharge status: Home / Self Care | Payer: BC Managed Care – PPO | Source: Ambulatory Visit | Attending: Family Medicine | Admitting: Family Medicine

## 2012-04-26 VITALS — BP 120/80 | Temp 98.1°F | Ht 65.75 in | Wt 189.0 lb

## 2012-04-26 DIAGNOSIS — N949 Unspecified condition associated with female genital organs and menstrual cycle: Secondary | ICD-10-CM

## 2012-04-26 DIAGNOSIS — M545 Low back pain: Secondary | ICD-10-CM

## 2012-04-26 DIAGNOSIS — Z1151 Encounter for screening for human papillomavirus (HPV): Secondary | ICD-10-CM | POA: Insufficient documentation

## 2012-04-26 DIAGNOSIS — M25559 Pain in unspecified hip: Secondary | ICD-10-CM

## 2012-04-26 DIAGNOSIS — Z01419 Encounter for gynecological examination (general) (routine) without abnormal findings: Secondary | ICD-10-CM | POA: Insufficient documentation

## 2012-04-26 DIAGNOSIS — J45909 Unspecified asthma, uncomplicated: Secondary | ICD-10-CM

## 2012-04-26 MED ORDER — TRAMADOL HCL 50 MG PO TABS: 50.0000 mg | ORAL_TABLET | Freq: Three times a day (TID) | ORAL | Status: DC | PRN

## 2012-04-26 MED ORDER — PREDNISONE 20 MG PO TABS: ORAL_TABLET | ORAL | Status: DC

## 2012-04-26 NOTE — Progress Notes (Signed)
  Subjective:    Patient ID: Brandi Sutton, female    DOB: 08/13/1975, 36 y.o.   MRN: [id number]  HPI is a 36 year old married female nonsmoker who comes in today for general physical examination  She has a history of intermittent low back pain rating down her left leg. She's been to physical therapy but does not do her exercises on a daily basis. Her weight is 189 pounds.  She smoked for 18 years quit 8 years ago. For the past 5 months she's had a cough. She's had a history of allergic rhinitis and taken Zyrtec  Her daughter Lequita Halt his 60 recently began having signs of depression with mood changes slitting her wrists. She's going to take her to a psychiatrist tomorrow    Review of Systems  Constitutional: Negative.   HENT: Negative.   Eyes: Negative.   Respiratory: Negative.   Cardiovascular: Negative.   Gastrointestinal: Negative.   Genitourinary: Negative.   Musculoskeletal: Negative.   Neurological: Negative.   Hematological: Negative.   Psychiatric/Behavioral: Negative.        Objective:   Physical Exam  Constitutional: She appears well-developed and well-nourished.  HENT:  Head: Normocephalic and atraumatic.  Right Ear: External ear normal.  Left Ear: External ear normal.  Nose: Nose normal.  Mouth/Throat: Oropharynx is clear and moist.  Eyes: EOM are normal. Pupils are equal, round, and reactive to light.  Neck: Normal range of motion. Neck supple. No thyromegaly present.  Cardiovascular: Normal rate, regular rhythm, normal heart sounds and intact distal pulses.  Exam reveals no gallop and no friction rub.   No murmur heard. Pulmonary/Chest: Effort normal and breath sounds normal.  Abdominal: Soft. Bowel sounds are normal. She exhibits no distension and no mass. There is no tenderness. There is no rebound.  Genitourinary: Vagina normal and uterus normal. No vaginal discharge found.       Bilateral breast exam normal  Musculoskeletal: Normal range of motion.    Lymphadenopathy:    She has no cervical adenopathy.  Neurological: She is alert. She has normal reflexes. No cranial nerve deficit. She exhibits normal muscle tone. Coordination normal.  Skin: Skin is warm and dry.       Total body skin exam normal  Psychiatric: She has a normal mood and affect. Her behavior is normal. Judgment and thought content normal.   symmetrical breath sounds except for mild late expiratory wheezing on forced expiration        Assessment & Plan:  Healthy female  Intermittent low back pain again prescribed daily exercises Motrin 600 mg twice a day when necessary  Cough secondary to underlying asthma,,,,,,, prednisone burst and taper

## 2012-04-26 NOTE — Patient Instructions (Signed)
For your cough start the prednisone as directed return in one week for followup  For your intermittent low back pain Motrin 600 mg twice daily for minor pain  Remember to do those exercises daily  Tramadol 1/2-1 tablet 3 times daily as needed for severe pain

## 2012-04-27 ENCOUNTER — Telehealth: Payer: Self-pay | Admitting: Family Medicine

## 2012-04-27 NOTE — Telephone Encounter (Signed)
Opened in error

## 2012-05-03 ENCOUNTER — Ambulatory Visit (INDEPENDENT_AMBULATORY_CARE_PROVIDER_SITE_OTHER): Payer: BC Managed Care – PPO | Admitting: Family Medicine

## 2012-05-03 ENCOUNTER — Encounter: Payer: Self-pay | Admitting: Family Medicine

## 2012-05-03 VITALS — BP 110/80 | Temp 98.3°F | Wt 192.0 lb

## 2012-05-03 DIAGNOSIS — J45909 Unspecified asthma, uncomplicated: Secondary | ICD-10-CM

## 2012-05-03 MED ORDER — HYDROCODONE-HOMATROPINE 5-1.5 MG/5ML PO SYRP: ORAL_SOLUTION | ORAL | Status: DC

## 2012-05-03 NOTE — Patient Instructions (Signed)
Continue 2 tabs of the prednisone daily until you feel significantly better and then begin to taper as outlined  Hydromet 1/2-1 teaspoon at bedtime when necessary for cough may repeat midnight

## 2012-05-03 NOTE — Progress Notes (Signed)
  Subjective:    Patient ID: Brandi Sutton, female    DOB: 01/04/1976, 36 y.o.   MRN: [id number]  HPI Brandi Sutton is a 36 year old married female nonsmoker who comes in today for followup of asthma  We saw her last week with a viral syndrome that triggered her asthma and started her on prednisone 40 mg a day. She states she doesn't feel better. No fever chills.   Review of Systems General and pulmonary review of systems otherwise negative    Objective:   Physical Exam  Well-developed well-nourished female no acute distress examination the lungs show symmetrical breath sounds mild late expiratory wheezing bilateral      Assessment & Plan:  Asthma resolving slowly plan continue prednisone 40 mg daily then taper slowly at Hydromet at bedtime

## 2013-06-07 ENCOUNTER — Other Ambulatory Visit: Payer: Self-pay | Admitting: Obstetrics and Gynecology

## 2013-06-14 ENCOUNTER — Other Ambulatory Visit: Payer: Self-pay | Admitting: Obstetrics and Gynecology

## 2013-07-19 ENCOUNTER — Telehealth: Payer: Self-pay | Admitting: Family Medicine

## 2013-07-19 ENCOUNTER — Ambulatory Visit (INDEPENDENT_AMBULATORY_CARE_PROVIDER_SITE_OTHER): Payer: BC Managed Care – PPO | Admitting: Family Medicine

## 2013-07-19 ENCOUNTER — Encounter: Payer: Self-pay | Admitting: Family Medicine

## 2013-07-19 VITALS — BP 130/90 | Temp 98.1°F | Wt 190.0 lb

## 2013-07-19 DIAGNOSIS — M26629 Arthralgia of temporomandibular joint, unspecified side: Secondary | ICD-10-CM | POA: Insufficient documentation

## 2013-07-19 DIAGNOSIS — J309 Allergic rhinitis, unspecified: Secondary | ICD-10-CM

## 2013-07-19 DIAGNOSIS — M2669 Other specified disorders of temporomandibular joint: Secondary | ICD-10-CM

## 2013-07-19 MED ORDER — FLUTICASONE PROPIONATE 50 MCG/ACT NA SUSP: 1.0000 | Freq: Every day | NASAL | Status: DC

## 2013-07-19 NOTE — Progress Notes (Signed)
Pre visit review using our clinic review tool, if applicable. No additional management support is needed unless otherwise documented below in the visit note. 

## 2013-07-19 NOTE — Telephone Encounter (Signed)
Severe sore throat, glands swollen,ear aches and sinus hurts. Would like to see the doc.

## 2013-07-19 NOTE — Progress Notes (Signed)
   Subjective:    Patient ID: Brandi Sutton, female    DOB: March 01, 1976, 37 y.o.   MRN: [id number]  HPI Brandi Sutton is a 37 year old married female nonsmoker who comes in today for evaluation of head congestion postnasal drip and pain in her right jaw  She's had symptoms of allergic rhinitis with head congestion postnasal drip for the last 3 days. She's also had pain in her right year. No history of trauma no fever   Review of Systems Review of systems negative    Objective:   Physical Exam  Well-developed well-nourished female no acute distress HEENT negative except for postnasal drip neck was supple no adenopathy there's point tenderness in the right TMJ consistent with TMJ syndrome      Assessment & Plan:  TMJ syndrome,,,,,,, Motrin,,,,,,,, soft diet,,,,,,,,,, mouth guard  Allergic rhinitis Zyrtec and steroid nasal spray,

## 2013-07-19 NOTE — Patient Instructions (Signed)
Steroid nasal spray,,,,, 1 spray up each nostril at bedtime  Zyrtec 10 mg plain,,,,,, 1 at bedtime  Motrin 6 mg twice daily with food  If after we you're still having pain in her right jaw then go ahead and get the mouth guard

## 2013-07-19 NOTE — Telephone Encounter (Signed)
Spoke with patient. Please schedule at 12:30 today

## 2013-09-05 ENCOUNTER — Ambulatory Visit (INDEPENDENT_AMBULATORY_CARE_PROVIDER_SITE_OTHER): Payer: BC Managed Care – PPO | Admitting: Family Medicine

## 2013-09-05 ENCOUNTER — Encounter: Payer: Self-pay | Admitting: Family Medicine

## 2013-09-05 ENCOUNTER — Ambulatory Visit (INDEPENDENT_AMBULATORY_CARE_PROVIDER_SITE_OTHER)
Admission: RE | Admit: 2013-09-05 | Discharge: 2013-09-05 | Disposition: A | Discharge status: Home / Self Care | Payer: BC Managed Care – PPO | Source: Ambulatory Visit | Attending: Family Medicine | Admitting: Family Medicine

## 2013-09-05 VITALS — BP 130/90 | Temp 98.4°F | Wt 195.0 lb

## 2013-09-05 DIAGNOSIS — J45909 Unspecified asthma, uncomplicated: Secondary | ICD-10-CM

## 2013-09-05 MED ORDER — FLUTICASONE-SALMETEROL 100-50 MCG/DOSE IN AEPB: 1.0000 | INHALATION_SPRAY | Freq: Two times a day (BID) | RESPIRATORY_TRACT | Status: DC

## 2013-09-05 MED ORDER — OMEPRAZOLE 20 MG PO CPDR: DELAYED_RELEASE_CAPSULE | ORAL | Status: DC

## 2013-09-05 NOTE — Progress Notes (Signed)
   Subjective:    Patient ID: Brandi Sutton, female    DOB: Mar 19, 1976, 38 y.o.   MRN: [id number]  HPI Brandi Sutton is a 38 year old married female nonsmoker who comes in for evaluation of a cough for year and a half  A half ago she developed a cough we saw her she was wheezing we gave her a short course of prednisone and the cough resolved. Since that time the episodes becoming more severe and more frequent. Her last round of prednisone was 8 months ago. She took the prednisone that time her cough away but as she stopped it came right back. She's tried all the antiallergy medicines at home  Also this past weekend she get some over-the-counter PPI which seem to decrease the cough however she's had no symptoms of reflux.  She's had no fever or hemoptysis weight loss etc.   Review of Systems    review of systems otherwise negative Objective:   Physical Exam  Well-developed well nourished female no acute distress vital signs stable she is afebrile HEENT negative neck was supple no adenopathy lungs are clear except for some symmetrical mild late expiratory wheezing. Cardiac exam normal      Assessment & Plan:  Persistent wheezing..........Marland Kitchen begin inhaled steroid,,,,, chest x-ray,,,,,,, anti-reflex program,,,,,,, immunology consult with Dr. Ishmael Holter

## 2013-09-05 NOTE — Patient Instructions (Signed)
Called the immunologist Dr. Levin Erp at [phone]  Advair,,,,,,,,,,,, 1 puff twice daily  Omeprazole,,,,,, one tablet before breakfast and one tablet prior to you female  Nothing new to drink for 3 hours prior to bedtime  Sleep on 2 pillows  No caffeine no alcohol or peppermint

## 2014-02-16 ENCOUNTER — Encounter: Payer: Self-pay | Admitting: Family Medicine

## 2014-02-16 ENCOUNTER — Ambulatory Visit (INDEPENDENT_AMBULATORY_CARE_PROVIDER_SITE_OTHER): Payer: BC Managed Care – PPO | Admitting: Family Medicine

## 2014-02-16 VITALS — BP 122/82 | Temp 98.3°F | Wt 172.4 lb

## 2014-02-16 DIAGNOSIS — E162 Hypoglycemia, unspecified: Secondary | ICD-10-CM

## 2014-02-16 LAB — CBC WITH DIFFERENTIAL/PLATELET
BASOS ABS: 0 10*3/uL (ref 0.0–0.1)
Basophils Relative: 0.4 % (ref 0.0–3.0)
Eosinophils Absolute: 0.1 10*3/uL (ref 0.0–0.7)
Eosinophils Relative: 1.9 % (ref 0.0–5.0)
HEMATOCRIT: 42 % (ref 36.0–46.0)
Hemoglobin: 14.2 g/dL (ref 12.0–15.0)
LYMPHS ABS: 1.6 10*3/uL (ref 0.7–4.0)
Lymphocytes Relative: 22 % (ref 12.0–46.0)
MCHC: 33.8 g/dL (ref 30.0–36.0)
MCV: 91.2 fl (ref 78.0–100.0)
MONO ABS: 0.5 10*3/uL (ref 0.1–1.0)
MONOS PCT: 6.1 % (ref 3.0–12.0)
NEUTROS PCT: 69.6 % (ref 43.0–77.0)
Neutro Abs: 5.1 10*3/uL (ref 1.4–7.7)
PLATELETS: 242 10*3/uL (ref 150.0–400.0)
RBC: 4.61 Mil/uL (ref 3.87–5.11)
RDW: 13.6 % (ref 11.5–15.5)
WBC: 7.3 10*3/uL (ref 4.0–10.5)

## 2014-02-16 LAB — HEMOGLOBIN A1C: Hgb A1c MFr Bld: 5.2 % (ref 4.6–6.5)

## 2014-02-16 LAB — BASIC METABOLIC PANEL
BUN: 12 mg/dL (ref 6–23)
CALCIUM: 9.6 mg/dL (ref 8.4–10.5)
CO2: 21 mEq/L (ref 19–32)
Chloride: 106 mEq/L (ref 96–112)
Creatinine, Ser: 0.8 mg/dL (ref 0.4–1.2)
GFR: 88.02 mL/min (ref >60.00)
Glucose, Bld: 90 mg/dL (ref 70–99)
POTASSIUM: 4.7 meq/L (ref 3.5–5.1)
SODIUM: 137 meq/L (ref 135–145)

## 2014-02-16 LAB — TSH: TSH: 0.49 u[IU]/mL (ref 0.35–4.50)

## 2014-02-16 NOTE — Progress Notes (Signed)
   Subjective:    Patient ID: Brandi Sutton, female    DOB: [birth year], 38 y.o.   MRN: [id number]  HPI Brandi Sutton is a 38 year old married female nonsmoker who comes in today for evaluation of suspected hypoglycemia  She's been not diet since the wintertime. She's lost 30 pounds. She is walking daily and avoiding carbohydrates and fats. She's had episodes of the last couple months where she feels shaky and nervous. She checked her blood sugar with a home meter but her blood sugars have been normal however she was waited a while for she checked her sugar  Review of systems otherwise negative   Review of Systems Review of systems otherwise negative    Objective:   Physical Exam  Well-developed well-nourished female no acute distress vital signs stable she is afebrile      Assessment & Plan:  Symptoms consistent with hypoglycemia.........Marland Kitchen begin workup.

## 2014-02-16 NOTE — Patient Instructions (Signed)
Continue your current diet and exercise program  If you begin to feel bad check your blood sugar immediately........... if it's below 70...........Marland Kitchen eat like butter crackers or a few sips of sugar  Return on Tuesday with a record of all your blood sugar readings

## 2014-02-21 ENCOUNTER — Ambulatory Visit (INDEPENDENT_AMBULATORY_CARE_PROVIDER_SITE_OTHER): Payer: BC Managed Care – PPO | Admitting: Family Medicine

## 2014-02-21 ENCOUNTER — Encounter: Payer: Self-pay | Admitting: Family Medicine

## 2014-02-21 VITALS — BP 110/80 | Temp 98.5°F | Wt 172.0 lb

## 2014-02-21 DIAGNOSIS — E162 Hypoglycemia, unspecified: Secondary | ICD-10-CM

## 2014-02-21 NOTE — Progress Notes (Signed)
   Subjective:    Patient ID: Brandi Sutton, female    DOB: 1976/07/22, 39 y.o.   MRN: [id number]  HPI Brandi Sutton is a 38 year old female who comes in today for followup of hypoglycemia  We saw her last week with episodes consistent with hypoglycemia. We gave her glucose monitor. She's been monitoring her blood sugar when she feels bad however blood sugars have all been normal. She states she's not had a severe episode like she had before.  All her labs were normal   Review of Systems Review of systems otherwise negative    Objective:   Physical Exam Well-developed well-nourished female no acute distress vital signs stable she is afebrile       Assessment & Plan:  Hypoglycemic episodes........... continue high protein diet....... avoid carbohydrates........ Chin consult when necessary.

## 2014-02-21 NOTE — Progress Notes (Signed)
Pre visit review using our clinic review tool, if applicable. No additional management support is needed unless otherwise documented below in the visit note. 

## 2014-09-11 ENCOUNTER — Encounter: Payer: Self-pay | Admitting: Family Medicine

## 2014-09-11 ENCOUNTER — Encounter: Payer: Self-pay | Admitting: *Deleted

## 2014-09-11 ENCOUNTER — Ambulatory Visit (INDEPENDENT_AMBULATORY_CARE_PROVIDER_SITE_OTHER): Payer: BLUE CROSS/BLUE SHIELD | Admitting: Family Medicine

## 2014-09-11 VITALS — BP 112/76 | HR 82 | Temp 97.9°F | Ht 65.75 in | Wt 165.0 lb

## 2014-09-11 DIAGNOSIS — M5441 Lumbago with sciatica, right side: Secondary | ICD-10-CM

## 2014-09-11 MED ORDER — PREDNISONE 20 MG PO TABS: ORAL_TABLET | ORAL | Status: DC

## 2014-09-11 MED ORDER — CYCLOBENZAPRINE HCL 5 MG PO TABS: 5.0000 mg | ORAL_TABLET | Freq: Three times a day (TID) | ORAL | Status: DC | PRN

## 2014-09-11 MED ORDER — HYDROCODONE-ACETAMINOPHEN 10-325 MG PO TABS: 1.0000 | ORAL_TABLET | Freq: Three times a day (TID) | ORAL | Status: DC | PRN

## 2014-09-11 NOTE — Progress Notes (Signed)
   Subjective:    Patient ID: Brandi Sutton, female    DOB: 1976-03-28, 39 y.o.   MRN: [id number]  HPI Brandi Sutton is a 39 year old married female nonsmoker who comes in today for evaluation of low back pain. She's had low back pain in the past. Her first episode was about 8 years ago when she bent over and felt a popping sensation in her back. Since that time it's been coming and going. Advise gotten better with rest and physical therapy.  8 days ago she began having a gradual onset of severe low back pain again. She says is constant a 7 on a scale of 1-10 Radius stent her mid left inner thigh. She's been taken 4 tablets of Aleve daily. The pain is increased by sitting coughing and having a bowel movement. She's been trying home PT like the she has done in the past but it hasn't helped   Review of Systems Review of systems otherwise negative    Objective:   Physical Exam  Well-developed well-nourished female no acute distress vital signs stable she's afebrile abdominal exam negative  She assumes a supine position without any discomfort. The sensation muscle strength reflexes all within normal limits. Straight leg raising negative      Assessment & Plan:  Lumbar disc disease,,,,,,,, treat symptomatically with bedrest medication follow-up in 48 hours

## 2014-09-11 NOTE — Progress Notes (Signed)
Pre visit review using our clinic review tool, if applicable. No additional management support is needed unless otherwise documented below in the visit note. 

## 2014-09-11 NOTE — Patient Instructions (Signed)
Prednisone 20 mg......... 2 tabs 3 days with a taper as outlined  Flexeril and Vicodin............ one half to one of each 3 times daily  Complete bed rest today Tuesday Wednesday  Return on Thursday for follow-up  Milk of magnesia or prune juice

## 2014-09-14 ENCOUNTER — Ambulatory Visit (INDEPENDENT_AMBULATORY_CARE_PROVIDER_SITE_OTHER): Payer: BLUE CROSS/BLUE SHIELD | Admitting: Family Medicine

## 2014-09-14 ENCOUNTER — Encounter: Payer: Self-pay | Admitting: Family Medicine

## 2014-09-14 VITALS — BP 118/78 | Temp 98.0°F | Wt 173.0 lb

## 2014-09-14 DIAGNOSIS — M5440 Lumbago with sciatica, unspecified side: Secondary | ICD-10-CM

## 2014-09-14 NOTE — Progress Notes (Signed)
   Subjective:    Patient ID: Brandi Sutton, female    DOB: 1976/01/07, 39 y.o.   MRN: [id number]  HPI Cherrish is a 39 year old female comes in today for evaluation of low back pain  We saw the first week was severe lumbar back pain. Her pain was a 7-8 on a scale of 1-10. Neurologically she was intact. We maintained her at bedrest with 40 mg of prednisone for 3 days and to taper Flexeril and Vicodin. She comes back today for follow-up sitting her pain is down to a 4. Again no neurologic symptoms  Bowel bladder function normal   Review of Systems Review of systems otherwise negative    Objective:   Physical Exam Well-developed and nourished female no acute distress vital signs stable she's afebrile neurologic exam again normal       Assessment & Plan:  Lumbar disc disease resolving with symptomatic therapy........ continue symptomatic therapy,,,,,,, back to work on Monday,,,,,,,, return when necessary,

## 2014-09-14 NOTE — Progress Notes (Signed)
Pre visit review using our clinic review tool, if applicable. No additional management support is needed unless otherwise documented below in the visit note. 

## 2014-09-14 NOTE — Patient Instructions (Signed)
Continue to taper the prednisone as outlined  Flexeril and Vicodin negative discussion  At this juncture I would recommend you get outside walk for 10 minutes, back and then lie down avoid sitting. Okay to go back to work on Monday. Take 600 mg of Motrin before you go to work along with prednisone. When you come home take a full or half of Flexeril and/or Vicodin and stay off her feet in the evening and to your pain quiets down.  Going forward weight loss and Williams exercises daily  Return when necessary

## 2014-10-05 ENCOUNTER — Telehealth: Payer: Self-pay | Admitting: Family Medicine

## 2014-10-05 NOTE — Telephone Encounter (Signed)
Pt called to say that she is still having back pain and is saying that she thinks she need an MRI. Would like a call back .

## 2014-10-23 NOTE — Telephone Encounter (Signed)
Please see

## 2014-10-24 NOTE — Telephone Encounter (Signed)
Per Dr Sherren Mocha patient should see a neurosurgeon first.  Patient would like to wait and then if no improvement she will go to a neurosurgeon.

## 2015-04-26 ENCOUNTER — Ambulatory Visit (INDEPENDENT_AMBULATORY_CARE_PROVIDER_SITE_OTHER): Payer: BLUE CROSS/BLUE SHIELD | Admitting: Family Medicine

## 2015-04-26 ENCOUNTER — Encounter: Payer: Self-pay | Admitting: Family Medicine

## 2015-04-26 VITALS — BP 120/82 | HR 75 | Temp 98.3°F | Wt 187.0 lb

## 2015-04-26 DIAGNOSIS — L237 Allergic contact dermatitis due to plants, except food: Secondary | ICD-10-CM | POA: Diagnosis not present

## 2015-04-26 DIAGNOSIS — L259 Unspecified contact dermatitis, unspecified cause: Secondary | ICD-10-CM | POA: Diagnosis not present

## 2015-04-26 MED ORDER — METHYLPREDNISOLONE ACETATE 80 MG/ML IJ SUSP
80.0000 mg | Freq: Once | INTRAMUSCULAR | Status: AC
  Administered 2015-04-26: 80 mg via INTRAMUSCULAR

## 2015-04-26 NOTE — Progress Notes (Signed)
PCP: Joycelyn Man, MD  Subjective:  Brandi Sutton is a 39 y.o. year old very pleasant female patient who presents with a rash. After contact with poision ivy while out gardening. Sunday. Rash on left arm, right foot, right ear. Was wearing shorts and flip flops. Pruritic and with vesicles. Spot in ear continues to drain from the vesicles and very irritating. Symptoms worsened for a day or so now stable.   ROS-not ill appearing, no fever/chills. No new medications. Not immunocompromised. No mucus membrane involvement.    Pertinent Past Medical History- TMJ, asthma  Medications- reviewed  Current Outpatient Prescriptions  Medication Sig Dispense Refill  . fluticasone (FLONASE) 50 MCG/ACT nasal spray Place 1 spray into both nostrils daily. 16 g 6  . omeprazole (PRILOSEC) 20 MG capsule 1 by mouth twice a day 60 capsule 3   No current facility-administered medications for this visit.    Objective: BP 120/82 mmHg  Pulse 75  Temp(Src) 98.3 F (36.8 C)  Wt 187 lb (84.823 kg) Gen: NAD, resting comfortably HEENT: Turbinates normal, TM normal. Right external ear is swollen and erythematous with clear drainage noted pooling in outer portion of ear, pharynx normal CV: RRR no murmurs rubs or gallops Lungs: CTAB no crackles, wheeze, rhonchi Ext: no edema Skin: warm, dry, no rash Also with vesicular patches on left forearm and right foot with erythematous base Neuro: grossly normal, moves all extremities  Assessment/Plan:  Contact dermatitis Due to poison ivy- patient prefers depo injection to taking course of prednisone so given in office. Return precautions advised.   Meds ordered this encounter  Medications  . methylPREDNISolone acetate (DEPO-MEDROL) injection 80 mg    Sig:

## 2015-04-26 NOTE — Patient Instructions (Signed)
Depo-medrol shot (long acting steroid to knock this poison ivy down)  Poison [employer] ivy is a rash caused by touching the leaves of the poison ivy plant. The rash often shows up 48 hours later. You might just have bumps, redness, and itching. Sometimes, blisters appear and break open. Your eyes may get puffy (swollen). Poison ivy often heals in 2 to 3 weeks without treatment. HOME CARE  If you touch poison ivy:  Wash your skin with soap and water right away. Wash under your fingernails. Do not rub the skin very hard.  Wash any clothes you were wearing.  Avoid poison ivy in the future. Poison ivy has 3 leaves on a stem.  Use medicine to help with itching as told by your doctor. Do not drive when you take this medicine.  Keep open sores dry, clean, and covered with a bandage and medicated cream, if needed.  Ask your doctor about medicine for children. GET HELP RIGHT AWAY IF:  You have open sores.  Redness spreads beyond the area of the rash.  There is yellowish white fluid (pus) coming from the rash.  Pain gets worse.  You have a temperature by mouth above 102 F (38.9 C), not controlled by medicine. MAKE SURE YOU:  Understand these instructions.  Will watch your condition.  Will get help right away if you are not doing well or get worse. Document Released: 08/30/2010 Document Revised: 10/20/2011 Document Reviewed: 08/30/2010 [facility] Patient Information 2015 [place], [place]. This information is not intended to replace advice given to you by your health care provider. Make sure you discuss any questions you have with your health care provider.

## 2015-08-12 HISTORY — PX: BREAST EXCISIONAL BIOPSY: SUR124

## 2015-10-15 ENCOUNTER — Encounter: Payer: Self-pay | Admitting: Adult Health

## 2015-10-15 ENCOUNTER — Ambulatory Visit (INDEPENDENT_AMBULATORY_CARE_PROVIDER_SITE_OTHER): Payer: BLUE CROSS/BLUE SHIELD | Admitting: Adult Health

## 2015-10-15 VITALS — BP 110/70 | Temp 98.0°F | Ht 65.75 in | Wt 190.9 lb

## 2015-10-15 DIAGNOSIS — R1011 Right upper quadrant pain: Secondary | ICD-10-CM

## 2015-10-15 DIAGNOSIS — R079 Chest pain, unspecified: Secondary | ICD-10-CM | POA: Diagnosis not present

## 2015-10-15 LAB — COMPREHENSIVE METABOLIC PANEL
ALT: 23 U/L (ref 0–35)
AST: 18 U/L (ref 0–37)
Albumin: 4.5 g/dL (ref 3.5–5.2)
Alkaline Phosphatase: 59 U/L (ref 39–117)
BUN: 7 mg/dL (ref 6–23)
CHLORIDE: 106 meq/L (ref 96–112)
CO2: 24 mEq/L (ref 19–32)
Calcium: 9.4 mg/dL (ref 8.4–10.5)
Creatinine, Ser: 0.82 mg/dL (ref 0.40–1.20)
GFR: 81.19 mL/min (ref >60.00)
GLUCOSE: 93 mg/dL (ref 70–99)
POTASSIUM: 4 meq/L (ref 3.5–5.1)
SODIUM: 138 meq/L (ref 135–145)
Total Bilirubin: 0.6 mg/dL (ref 0.2–1.2)
Total Protein: 7 g/dL (ref 6.0–8.3)

## 2015-10-15 LAB — CBC WITH DIFFERENTIAL/PLATELET
Basophils Absolute: 0.1 10*3/uL (ref 0.0–0.1)
Basophils Relative: 0.8 % (ref 0.0–3.0)
EOS ABS: 0.1 10*3/uL (ref 0.0–0.7)
Eosinophils Relative: 2 % (ref 0.0–5.0)
HCT: 42.9 % (ref 36.0–46.0)
Hemoglobin: 14.7 g/dL (ref 12.0–15.0)
LYMPHS PCT: 22.8 % (ref 12.0–46.0)
Lymphs Abs: 1.5 10*3/uL (ref 0.7–4.0)
MCHC: 34.3 g/dL (ref 30.0–36.0)
MCV: 89.7 fl (ref 78.0–100.0)
Monocytes Absolute: 0.6 10*3/uL (ref 0.1–1.0)
Monocytes Relative: 8.4 % (ref 3.0–12.0)
NEUTROS PCT: 66 % (ref 43.0–77.0)
Neutro Abs: 4.4 10*3/uL (ref 1.4–7.7)
Platelets: 269 10*3/uL (ref 150.0–400.0)
RBC: 4.78 Mil/uL (ref 3.87–5.11)
RDW: 12.7 % (ref 11.5–15.5)
WBC: 6.6 10*3/uL (ref 4.0–10.5)

## 2015-10-15 LAB — LIPASE: LIPASE: 23 U/L (ref 11.0–59.0)

## 2015-10-15 MED ORDER — OXYCODONE HCL 5 MG PO TABS: 5.0000 mg | ORAL_TABLET | ORAL | Status: DC | PRN

## 2015-10-15 MED ORDER — ONDANSETRON HCL 4 MG PO TABS: 4.0000 mg | ORAL_TABLET | Freq: Three times a day (TID) | ORAL | Status: DC | PRN

## 2015-10-15 NOTE — Patient Instructions (Addendum)
It was great meeting you today!  Your exam is consistent with cholecystitis.   I have sent in a prescription for Zofran, take this for nausea. You can take the oxycodone every 4 hours as needed for break through pain.   I will follow up with you regarding your blood work and ultrasound results.   Please let me know if you need anything.  Cholecystitis Cholecystitis is inflammation of the gallbladder. It is often called a gallbladder attack. The gallbladder is a pear-shaped organ that lies beneath the liver on the right side of the body. The gallbladder stores bile, which is a fluid that helps the body to digest fats. If bile builds up in your gallbladder, your gallbladder becomes inflamed. This condition may occur suddenly (be acute). Repeat episodes of acute cholecystitis or prolonged episodes may lead to a long-term (chronic) condition. Cholecystitis is serious and it requires treatment.  CAUSES The most common cause of this condition is gallstones. Gallstones can block the tube (duct) that carries bile out of your gallbladder. This causes bile to build up. Other causes of this condition include:  Damage to the gallbladder due to a decrease in blood flow.  Infections in the bile ducts.  Scars or kinks in the bile ducts.  Tumors in the liver, pancreas, or gallbladder. RISK FACTORS This condition is more likely to develop in:  People who have sickle cell disease.  People who take birth control pills or use estrogen.  People who have alcoholic liver disease.  People who have liver cirrhosis.  People who have their nutrition delivered through a vein (parenteral nutrition).  People who do not eat or drink (do fasting) for a long period of time.  People who are obese.  People who have rapid weight loss.  People who are pregnant.  People who have increased triglyceride levels.  People who have pancreatitis. SYMPTOMS Symptoms of this condition include:  Abdominal pain,  especially in the upper right area of the abdomen.  Abdominal tenderness or bloating.  Nausea.  Vomiting.  Fever.  Chills.  Yellowing of the skin and the whites of the eyes (jaundice). DIAGNOSIS This condition is diagnosed with a medical history and physical exam. You may also have other tests, including:  Imaging tests, such as:  An ultrasound of the gallbladder.  A CT scan of the abdomen.  A gallbladder nuclear scan (HIDA scan). This scan allows your health care provider to see the bile moving from your liver to your gallbladder and to your small intestine.  MRI.  Blood tests, such as:  A complete blood count, because the white blood cell count may be higher than normal.  Liver function tests, because some levels may be higher than normal with certain types of gallstones. TREATMENT Treatment may include:  Fasting for a certain amount of time.  IV fluids.  Medicine to treat pain or vomiting.  Antibiotic medicine.  Surgery to remove your gallbladder (cholecystectomy). This may happen immediately or at a later time. [place] care will depend on your treatment. In general:  Take over-the-counter and prescription medicines only as told by your health care provider.  If you were prescribed an antibiotic medicine, take it as told by your health care provider. Do not stop taking the antibiotic even if you start to feel better.  Follow instructions from your health care provider about what to eat or drink. When you are allowed to eat, avoid eating or drinking anything that triggers your symptoms.  Keep  all follow-up visits as told by your health care provider. This is important. SEEK MEDICAL CARE IF:  Your pain is not controlled with medicine.  You have a fever. SEEK IMMEDIATE MEDICAL CARE IF:  Your pain moves to another part of your abdomen or to your back.  You continue to have symptoms or you develop new symptoms even with treatment.    This information is not intended to replace advice given to you by your health care provider. Make sure you discuss any questions you have with your health care provider.   Document Released: 07/28/2005 Document Revised: 04/18/2015 Document Reviewed: 11/08/2014 Elsevier Interactive Patient Education [employer].

## 2015-10-15 NOTE — Addendum Note (Signed)
Addended by: Elmer Picker on: 10/15/2015 10:26 AM   Modules accepted: Orders

## 2015-10-15 NOTE — Progress Notes (Addendum)
Subjective:    Patient ID: Brandi Sutton, female    DOB: 01-12-1976, 40 y.o.   MRN: [id number]  HPI  40 year old female who presents to the office today with "gallbladder issue"s  Her symptoms started two weeks ago with indigestion.One week ago she had her first " bad attack" where she felt like " I was breathing fire". She started taking Prevacid which helped with the indigestion but did not help with the pain. Six days ago she had another attack with SOB, chest pain, and right sided abdominal pain.   Last night she had another bad attack  and felt like she should have gone to the ER. She reports that she does have nausea but has not been vomiting. She denies fevers.   She started to notice yellow stool yesterday without diarrhea. This morning she had burning, yellow diarrhea.   She eats healthy, drinks alcohol rarely.   She has a family history of non- alcoholic cirrhosis.    Review of Systems  Constitutional: Positive for activity change and appetite change. Negative for fever, chills and unexpected weight change.  Respiratory: Positive for shortness of breath. Negative for chest tightness, wheezing and stridor.   Cardiovascular: Positive for chest pain. Negative for palpitations and leg swelling.  Gastrointestinal: Positive for nausea and diarrhea. Negative for vomiting, constipation, blood in stool, abdominal distention, anal bleeding and rectal pain.  Skin: Negative.   Neurological: Negative.   All other systems reviewed and are negative.  Past Medical History  Diagnosis Date  . ASTHMA 03/31/2007  . DYSFUNCTIONAL UTERINE BLEEDING 01/24/2010  . CONTACT DERMATITIS&OTHER ECZEMA DUE TO PLANTS 12/03/2009  . HIP PAIN, RIGHT 01/22/2009  . LOW BACK PAIN, ACUTE 04/17/2008    Social History   Social History  . Marital Status: Married    Spouse Name: N/A  . Number of Children: N/A  . Years of Education: N/A   Occupational History  . Not on file.   Social History Main Topics  .  Smoking status: Former Research scientist (life sciences)  . Smokeless tobacco: Not on file  . Alcohol Use: Not on file  . Drug Use: Not on file  . Sexual Activity: Not on file   Other Topics Concern  . Not on file   Social History Narrative    Past Surgical History  Procedure Laterality Date  . Tonsillectomy and adenoidectomy    . Shoulder surgery      Family History  Problem Relation Age of Onset  . Depression Other   . Diabetes Other   . Hypertension Other   . Cancer Other     lung, colon  . Stroke Other     Allergies  Allergen Reactions  . Bupropion Hcl   . Penicillins     Current Outpatient Prescriptions on File Prior to Visit  Medication Sig Dispense Refill  . fluticasone (FLONASE) 50 MCG/ACT nasal spray Place 1 spray into both nostrils daily. 16 g 6  . omeprazole (PRILOSEC) 20 MG capsule 1 by mouth twice a day 60 capsule 3   No current facility-administered medications on file prior to visit.    BP 110/70 mmHg  Temp(Src) 98 F (36.7 C) (Oral)  Ht 5' 5.75" (1.67 m)  Wt 190 lb 14.4 oz (86.592 kg)  BMI 31.05 kg/m2       Objective:   Physical Exam  Constitutional: She is oriented to person, place, and time. She appears well-developed and well-nourished. No distress.  Cardiovascular: Normal rate, regular rhythm, normal heart  sounds and intact distal pulses.  Exam reveals no gallop and no friction rub.   No murmur heard. Pulmonary/Chest: Effort normal and breath sounds normal. No respiratory distress. She has no wheezes. She has no rales. She exhibits no tenderness.  Abdominal: Soft. Bowel sounds are normal. She exhibits no distension and no mass. There is tenderness in the right upper quadrant, right lower quadrant and epigastric area. There is no rigidity, no rebound, no guarding, no CVA tenderness, no tenderness at McBurney's point and negative Murphy's sign. No hernia.  Neurological: She is alert and oriented to person, place, and time.  Skin: Skin is warm and dry. No rash noted.  She is not diaphoretic. No erythema. No pallor.  Psychiatric: She has a normal mood and affect. Her behavior is normal. Judgment normal.  Nursing note and vitals reviewed.     Assessment & Plan:  1. Chest pain, unspecified chest pain type - EKG 12-Lead, NSR, Rate 73  2. Right upper quadrant pain - Likely gallbladder but will r/o liver as well. Possibly gastric ulcer - Lipase - US Abdomen Complete - CBC with Differential/Platelet - Comprehensive metabolic panel - Hepatic function panel - ondansetron (ZOFRAN) 4 MG tablet; Take 1 tablet (4 mg total) by mouth every 8 (eight) hours as needed for nausea or vomiting.  Dispense: 20 tablet; Refill: 0 - oxyCODONE (OXY IR/ROXICODONE) 5 MG immediate release tablet; Take 1 tablet (5 mg total) by mouth every 4 (four) hours as needed for severe pain.  Dispense: 30 tablet; Refill: 0 - Consider referral to general surgery.

## 2015-10-16 ENCOUNTER — Ambulatory Visit
Admission: RE | Admit: 2015-10-16 | Discharge: 2015-10-16 | Disposition: A | Discharge status: Home / Self Care | Payer: BLUE CROSS/BLUE SHIELD | Source: Ambulatory Visit | Attending: Adult Health | Admitting: Adult Health

## 2015-10-16 ENCOUNTER — Other Ambulatory Visit: Payer: Self-pay | Admitting: Adult Health

## 2015-10-16 ENCOUNTER — Telehealth: Payer: Self-pay | Admitting: Adult Health

## 2015-10-16 MED ORDER — PANTOPRAZOLE SODIUM 40 MG PO TBEC: 40.0000 mg | DELAYED_RELEASE_TABLET | Freq: Every day | ORAL | Status: DC

## 2015-10-16 NOTE — Telephone Encounter (Signed)
Updated patient on her [place] results. She continues to have "attacks" after she eats. Will r/o ulcer at this point. Consider CT of abdomen in the future if no improvement with switching her to Protonix.

## 2015-10-25 ENCOUNTER — Telehealth: Payer: Self-pay | Admitting: Adult Health

## 2015-10-25 ENCOUNTER — Telehealth: Payer: Self-pay | Admitting: Family Medicine

## 2015-10-25 DIAGNOSIS — R1011 Right upper quadrant pain: Secondary | ICD-10-CM

## 2015-10-25 NOTE — Telephone Encounter (Signed)
Called and left a message for pt to return call.

## 2015-10-25 NOTE — Addendum Note (Signed)
Addended by: Colleen Can on: 10/25/2015 04:05 PM   Modules accepted: Orders

## 2015-10-25 NOTE — Telephone Encounter (Signed)
Pt Ct went to clinical review informed it would take 2 business day for approval pt scheduled for tomorrow please advise / stacy wants to know if she should keep the pt scheduled or reschedul until insurance apprve test -pending insurance approval  Pt insurance is [employer] # TRACKING NUMBER FOR PT [id number] spoke with the Authorization rep on the phone with [facility] ([place]

## 2015-10-25 NOTE — Telephone Encounter (Signed)
If the protonix are not working,I will put in a referral to GI and have an CT of the abdomen done.   Let me know if this is ok with the patient.

## 2015-10-25 NOTE — Telephone Encounter (Signed)
Pt call to say she is not doing any better. Said she had a real bad attack last night. Her US showed she had ulcers. Would like a call back .   [phone]

## 2015-10-25 NOTE — Telephone Encounter (Signed)
Called and spoke with pt and pt states she has continued to have episodes periodically but she had a really bad one last night.  Per pt the pain was so bad it caused her to have an anxiety attack.  Pt is still not able to eat and has lost  4 to 5 pounds since the last time she was seen.  Pt feels like there is something pushing in front which is giving her the feeling of being "full" and that something is sitting in her chest.  Pt continues to have pain between her shoulder blades and around her side.  Pt was given nausea medication and took it due to feeling nauseous at times.  Pt denies vomiting and has not noticed a fever but she does feel clammy.  Pt can be reached at her cell phone or work phone [phone] ext. [phone]). Pls advise.

## 2015-10-25 NOTE — Telephone Encounter (Signed)
Pt Ct went to clinical review informed it would take 2 business day for approval( tracking # [id number]). pt scheduled for tomorrow, stacy want to know what should she do keep pt scheduled or reschedule her  please advise ,

## 2015-10-25 NOTE — Telephone Encounter (Signed)
Pt Ct went to clinical review informed it would take 2 business day for approval pt scheduled for tomorrow please advise / stacy wants to know if she should keep the pt scheduled or reschedul until insurance apprve test -pending insurance approval   Pt insurance is [employer] # TRACKING NUMBER FOR PT [id number]  spoke with the  Authorization rep on the phone with [facility]

## 2015-10-25 NOTE — Telephone Encounter (Signed)
Called and spoke with pt and pt is aware.  Pt would like both done.  Referrals entered.

## 2015-10-25 NOTE — Telephone Encounter (Signed)
She can wait the two days

## 2015-10-26 ENCOUNTER — Inpatient Hospital Stay: Admission: RE | Admit: 2015-10-26 | Payer: BLUE CROSS/BLUE SHIELD | Source: Ambulatory Visit

## 2015-10-26 NOTE — Telephone Encounter (Signed)
Brandi Sutton is aware.

## 2015-10-30 ENCOUNTER — Inpatient Hospital Stay: Admission: RE | Admit: 2015-10-30 | Payer: BLUE CROSS/BLUE SHIELD | Source: Ambulatory Visit

## 2015-11-08 ENCOUNTER — Ambulatory Visit (INDEPENDENT_AMBULATORY_CARE_PROVIDER_SITE_OTHER): Payer: BLUE CROSS/BLUE SHIELD | Admitting: Gastroenterology

## 2015-11-08 ENCOUNTER — Encounter: Payer: Self-pay | Admitting: Gastroenterology

## 2015-11-08 VITALS — BP 120/80 | HR 78 | Ht 66.5 in | Wt 189.0 lb

## 2015-11-08 DIAGNOSIS — R1011 Right upper quadrant pain: Secondary | ICD-10-CM | POA: Diagnosis not present

## 2015-11-08 DIAGNOSIS — R0789 Other chest pain: Secondary | ICD-10-CM

## 2015-11-08 NOTE — Progress Notes (Signed)
[facility] Consult Note:  History: Brandi Sutton 11/08/2015  Referring physician: Joycelyn Man, MD  Reason for consult/chief complaint: RUQ pain; Chest Pain; and bowel changes   Subjective HPI:  This is the initial office consult for a 40 year old woman with about a month of upper GI symptoms. It began with some dyspepsia including upset stomach and pyrosis, then progressed to frequent episodes of postprandial right upper quadrant pain that is both sharp and squeezing with radiation to the mid back. Occasionally the nonexertional chest pain will occur by itself, but usually with the right upper quadrant pain. Her bowel habits are also somewhat irregular now, she has bloating with certain foods. There's been no travel, recent antibiotic use, she does have well water at home and uses NSAIDs a few times a week. She denies nausea vomiting early satiety weight loss or rectal bleeding.   ROS:  Review of Systems  Constitutional: Negative for appetite change and unexpected weight change.  HENT: Negative for mouth sores and voice change.   Eyes: Negative for pain and redness.  Respiratory: Negative for cough and shortness of breath.   Cardiovascular: Negative for chest pain and palpitations.  Genitourinary: Negative for dysuria and hematuria.  Musculoskeletal: Negative for myalgias and arthralgias.  Skin: Negative for pallor and rash.  Neurological: Negative for weakness and headaches.  Hematological: Negative for adenopathy.     Past Medical History: Past Medical History  Diagnosis Date  . ASTHMA 03/31/2007  . DYSFUNCTIONAL UTERINE BLEEDING 01/24/2010  . CONTACT DERMATITIS&OTHER ECZEMA DUE TO PLANTS 12/03/2009  . HIP PAIN, RIGHT 01/22/2009  . LOW BACK PAIN, ACUTE 04/17/2008     Past Surgical History: Past Surgical History  Procedure Laterality Date  . Tonsillectomy and adenoidectomy       Family History: Family History  Problem Relation Age of Onset  .  Depression Mother   . Diabetes Mother     diet controlled  . Hypertension Father   . Lung cancer Paternal Grandfather   . Stroke Maternal Grandfather   . Diabetes Father     diet controlled  . Transient ischemic attack Mother   . Colon polyps Mother     pre-cancerous  . Bowel Disease Father     blockage, had to wear a bag for awhile   Mother GB out Social History: Social History   Social History  . Marital Status: Married    Spouse Name: N/A  . Number of Children: 1  . Years of Education: N/A   Occupational History  . Chartered certified accountant    Social History Main Topics  . Smoking status: Former Smoker    Types: Cigarettes    Quit date: 12/09/2005  . Smokeless tobacco: Never Used  . Alcohol Use: 0.0 oz/week    0 Standard drinks or equivalent per week     Comment: social  . Drug Use: No  . Sexual Activity: Not Asked   Other Topics Concern  . None   Social History Narrative    Allergies: Allergies  Allergen Reactions  . Bupropion Hcl   . Penicillins     Outpatient Meds: Current Outpatient Prescriptions  Medication Sig Dispense Refill  . cetirizine (ZYRTEC) 10 MG tablet Take 10 mg by mouth daily.    . fluticasone (FLONASE) 50 MCG/ACT nasal spray Place 1 spray into both nostrils daily. (Patient taking differently: Place 1 spray into both nostrils as needed. ) 16 g 6  . ondansetron (ZOFRAN) 4 MG tablet Take 1 tablet (4 mg total) by  mouth every 8 (eight) hours as needed for nausea or vomiting. 20 tablet 0  . oxyCODONE (OXY IR/ROXICODONE) 5 MG immediate release tablet Take 1 tablet (5 mg total) by mouth every 4 (four) hours as needed for severe pain. 30 tablet 0  . pantoprazole (PROTONIX) 40 MG tablet Take 1 tablet (40 mg total) by mouth daily. 30 tablet 3   No current facility-administered medications for this visit.      ___________________________________________________________________ Objective  Exam:  BP 120/80 mmHg  Pulse 78  Ht 5' 6.5" (1.689 m)   Wt 189 lb (85.73 kg)  BMI 30.05 kg/m2   General: this is a(n) Well-appearing woman with good muscle mass   Eyes: sclera anicteric, no redness  ENT: oral mucosa moist without lesions, no cervical or supraclavicular lymphadenopathy, good dentition  CV: RRR without murmur, S1/S2, no JVD, no peripheral edema  Resp: clear to auscultation bilaterally, normal RR and effort noted  GI: soft, no tenderness, with active bowel sounds. No guarding or palpable organomegaly noted.  Skin; warm and dry, no rash or jaundice noted  Neuro: awake, alert and oriented x 3. Normal gross motor function and fluent speech  Labs:  Recent normal LFTs  Radiologic Studies:  RUQ U/S nml  Assessment: Encounter Diagnoses  Name Primary?  . RUQ pain Yes  . Other chest pain     Sounds most like biliary colic, less likely peptic ulcer. She may have concomitant GERD. Bowel habit changes are also often seen with biliary colic.  Plan:  EGD.  The benefits and risks of the planned procedure were described in detail with the patient or (when appropriate) their health care proxy.  Risks were outlined as including, but not limited to, bleeding, infection, perforation, adverse medication reaction leading to cardiac or pulmonary decompensation, or pancreatitis (if ERCP).  The limitation of incomplete mucosal visualization was also discussed.  No guarantees or warranties were given.   If negative, trial of antispasmodic and surgical consult.  Thank you for the courtesy of this consult.  Please call me with any questions or concerns.  Nelida Meuse III

## 2015-11-08 NOTE — Patient Instructions (Addendum)
If you are age 42 or older, your body mass index should be between 23-30. Your Body mass index is 30.05 kg/(m^2). If this is out of the aforementioned range listed, please consider follow up with your Primary Care Provider.  If you are age 33 or younger, your body mass index should be between 19-25. Your Body mass index is 30.05 kg/(m^2). If this is out of the aformentioned range listed, please consider follow up with your Primary Care Provider.   You have been scheduled for an endoscopy. Please follow written instructions given to you at your visit today. If you use inhalers (even only as needed), please bring them with you on the day of your procedure. Your physician has requested that you go to www.startemmi.com and enter the access code given to you at your visit today. This web site gives a general overview about your procedure. However, you should still follow specific instructions given to you by our office regarding your preparation for the procedure.   Thank you for choosing [facility]  Dr Wilfrid Lund III

## 2015-11-28 ENCOUNTER — Ambulatory Visit (AMBULATORY_SURGERY_CENTER): Payer: BLUE CROSS/BLUE SHIELD | Admitting: Gastroenterology

## 2015-11-28 ENCOUNTER — Telehealth: Payer: Self-pay | Admitting: Gastroenterology

## 2015-11-28 ENCOUNTER — Encounter: Payer: Self-pay | Admitting: Gastroenterology

## 2015-11-28 VITALS — BP 104/68 | HR 60 | Temp 99.3°F | Resp 9

## 2015-11-28 DIAGNOSIS — K2990 Gastroduodenitis, unspecified, without bleeding: Secondary | ICD-10-CM

## 2015-11-28 DIAGNOSIS — R1011 Right upper quadrant pain: Secondary | ICD-10-CM

## 2015-11-28 DIAGNOSIS — K2970 Gastritis, unspecified, without bleeding: Secondary | ICD-10-CM | POA: Diagnosis not present

## 2015-11-28 MED ORDER — SODIUM CHLORIDE 0.9 % IV SOLN: 500.0000 mL | INTRAVENOUS | Status: DC

## 2015-11-28 MED ORDER — HYOSCYAMINE SULFATE 0.125 MG SL SUBL: 0.1250 mg | SUBLINGUAL_TABLET | Freq: Three times a day (TID) | SUBLINGUAL | Status: DC

## 2015-11-28 NOTE — Progress Notes (Signed)
No problems noted in the recovery room. maw 

## 2015-11-28 NOTE — Telephone Encounter (Signed)
Brandi Sutton,    Please send a referral to Dr Donne Hazel at [facility] for RUQ pain. My office consult, recent RUQ ultrasound and my EGD report can be found in Epic for them to review. Thank you.  - HD

## 2015-11-28 NOTE — Patient Instructions (Addendum)
YOU HAD AN ENDOSCOPIC PROCEDURE TODAY AT [facility]:   Refer to the procedure report that was given to you for any specific questions about what was found during the examination.  If the procedure report does not answer your questions, please call your gastroenterologist to clarify.  If you requested that your care partner not be given the details of your procedure findings, then the procedure report has been included in a sealed envelope for you to review at your convenience later.  YOU SHOULD EXPECT: Some feelings of bloating in the abdomen. Passage of more gas than usual.  Walking can help get rid of the air that was put into your GI tract during the procedure and reduce the bloating. If you had a lower endoscopy (such as a colonoscopy or flexible sigmoidoscopy) you may notice spotting of blood in your stool or on the toilet paper. If you underwent a bowel prep for your procedure, you may not have a normal bowel movement for a few days.  Please Note:  You might notice some irritation and congestion in your nose or some drainage.  This is from the oxygen used during your procedure.  There is no need for concern and it should clear up in a day or so.  SYMPTOMS TO REPORT IMMEDIATELY:    Following upper endoscopy (EGD)  Vomiting of blood or coffee ground material  New chest pain or pain under the shoulder blades  Painful or persistently difficult swallowing  New shortness of breath  Fever of 100F or higher  Black, tarry-looking stools  For urgent or emergent issues, a gastroenterologist can be reached at any hour by calling [phone].   DIET: Your first meal following the procedure should be a small meal and then it is ok to progress to your normal diet. Heavy or fried foods are harder to digest and may make you feel nauseous or bloated.  Likewise, meals heavy in dairy and vegetables can increase bloating.  Drink plenty of fluids but you should avoid alcoholic beverages  for 24 hours.  ACTIVITY:  You should plan to take it easy for the rest of today and you should NOT DRIVE or use heavy machinery until tomorrow (because of the sedation medicines used during the test).    FOLLOW UP: Our staff will call the number listed on your records the next business day following your procedure to check on you and address any questions or concerns that you may have regarding the information given to you following your procedure. If we do not reach you, we will leave a message.  However, if you are feeling well and you are not experiencing any problems, there is no need to return our call.  We will assume that you have returned to your regular daily activities without incident.  If any biopsies were taken you will be contacted by phone or by letter within the next 1-3 weeks.  Please call us at [phone] if you have not heard about the biopsies in 3 weeks.    SIGNATURES/CONFIDENTIALITY: You and/or your care partner have signed paperwork which will be entered into your electronic medical record.  These signatures attest to the fact that that the information above on your After Visit Summary has been reviewed and is understood.  Full responsibility of the confidentiality of this discharge information lies with you and/or your care-partner.    Handout was given to your care partner on gastritis. You may resume your current medications today. Rx  was sent to [facility]. For hyoscyamine take before meals. Surgical consult for possible biliary colic.  The nurse on the 3rd floor, office will set up this appointment and call you with it. Await biopsy results. Please call if any questions or concerns.

## 2015-11-28 NOTE — Op Note (Signed)
[facility] Patient Name: Brandi Sutton Procedure Date: 11/28/2015 9:15 AM MRN: [id number] Endoscopist: Mallie Mussel L. Loletha Carrow , MD Age: 40 Date of Birth: June 28, 1976 Gender: Female Procedure:                Upper GI endoscopy Indications:              Abdominal pain in the right upper quadrant Medicines:                Monitored Anesthesia Care Procedure:                Pre-Anesthesia Assessment:                           - Prior to the procedure, a History and Physical                            was performed, and patient medications and                            allergies were reviewed. The patient's tolerance of                            previous anesthesia was also reviewed. The risks                            and benefits of the procedure and the sedation                            options and risks were discussed with the patient.                            All questions were answered, and informed consent                            was obtained. Prior Anticoagulants: The patient has                            taken no previous anticoagulant or antiplatelet                            agents. ASA Grade Assessment: II - A patient with                            mild systemic disease. After reviewing the risks                            and benefits, the patient was deemed in                            satisfactory condition to undergo the procedure.                           After obtaining informed consent, the endoscope was  passed under direct vision. Throughout the                            procedure, the patient's blood pressure, pulse, and                            oxygen saturations were monitored continuously. The                            Model GIF-[id number]) scope was introduced                            through the mouth, and advanced to the second part                            of duodenum. The upper GI endoscopy was                   accomplished without difficulty. The patient                            tolerated the procedure well. Scope In: Scope Out: Findings:                 The esophagus was normal.                           Mild inflammation characterized by scant flecks of                            heme was found in the gastric antrum. Biopsies were                            taken with a cold forceps for histology.                           The examined duodenum was normal.                           The cardia and gastric fundus were normal on                            retroflexion. Complications:            No immediate complications. Estimated Blood Loss:     Estimated blood loss: none. Impression:               - Normal esophagus.                           - Gastritis. Biopsied.                           - Normal examined duodenum.                           If biopsies are negative for H. pylori, then the  endoscopic finding does not likely explain the                            patient's symptoms. Recommendation:           - Resume previous diet.                           - Trial of hyoscyamine 0.125 mg before meals                           Surgical consult for possible biliary colic                           - Continue present medications. Clara Smolen L. Loletha Carrow, MD 11/28/2015 9:44:16 AM This report has been signed electronically.

## 2015-11-28 NOTE — Progress Notes (Signed)
Called to room to assist during endoscopic procedure.  Patient ID and intended procedure confirmed with present staff. Received instructions for my participation in the procedure from the performing physician.  

## 2015-11-28 NOTE — Progress Notes (Signed)
Report to PACU, RN, vss, BBS= Clear.  

## 2015-11-29 ENCOUNTER — Telehealth: Payer: Self-pay

## 2015-11-29 NOTE — Telephone Encounter (Signed)
Left message on answering machine. 

## 2015-12-03 ENCOUNTER — Telehealth: Payer: Self-pay

## 2015-12-03 ENCOUNTER — Telehealth: Payer: Self-pay | Admitting: Gastroenterology

## 2015-12-03 NOTE — Telephone Encounter (Signed)
Did you want to sent pt to [facility] for a surgical consult or not until after biopsy results return form her recent endoscopy? Please advise. Thank you.

## 2015-12-03 NOTE — Telephone Encounter (Signed)
Please refer to general surgery now.  Thanks for checking - HD

## 2015-12-03 NOTE — Telephone Encounter (Signed)
Faxed referral to [facility] as directed by Dr Loletha Carrow. Awaiting appointment info. Pt is aware.

## 2015-12-03 NOTE — Telephone Encounter (Signed)
Pt had an EGD on 11-28-2015 Referral to surgery for biliary colic requested at that time by Dr Loletha Carrow.

## 2015-12-03 NOTE — Telephone Encounter (Signed)
Patient notified and aware of path report and the referral to [facility].

## 2015-12-04 NOTE — Telephone Encounter (Signed)
Pts appointment has been booked for 12-13-15 @ 3:40pm with Dr Chrissie Noa. Pt contacted and is aware.

## 2015-12-04 NOTE — Telephone Encounter (Signed)
Records faxed on 12-03-2015 to [facility].

## 2015-12-18 ENCOUNTER — Telehealth: Payer: Self-pay | Admitting: Family Medicine

## 2015-12-18 NOTE — Telephone Encounter (Signed)
Husband states pt is in so much pain from her gall bladder, and he does not feel she should endure this anymore. Pt comes home every day crying in pain. Pt is scheduled for surgery on 5/22 with [facility].  Pt is hoping Dr Sherren Mocha can help pt get in sooner,or what does he advise. Husband would like a call back.

## 2015-12-19 NOTE — Telephone Encounter (Signed)
Per Dr Sherren Mocha.  Patient should call surgeon.  Spoke to husband.

## 2015-12-20 ENCOUNTER — Other Ambulatory Visit: Payer: Self-pay | Admitting: General Surgery

## 2015-12-20 NOTE — Patient Instructions (Signed)
Brandi Sutton  12/20/2015   Your procedure is scheduled on: 12/25/2015    Report to [facility] Main  Entrance take [facility]  elevators to 3rd floor to  [place] at    0730 AM.  Call this number if you have problems the morning of surgery [phone]   Remember: ONLY 1 PERSON MAY GO WITH YOU TO SHORT STAY TO GET  READY MORNING OF [place].  Do not eat food or drink liquids :After Midnight.     Take these medicines the morning of surgery with A SIP OF WATER: Albuterol Inhaler if needed and bring, Zyrtec, Flonase if needed, Prevacid, Oxycodone if needed                                 You may not have any metal on your body including hair pins and              piercings  Do not wear jewelry, make-up, lotions, powders or perfumes, deodorant             Do not wear nail polish.  Do not shave  48 hours prior to surgery.             Do not bring valuables to the hospital. [place].  Contacts, dentures or bridgework may not be worn into surgery.      Patients discharged the day of surgery will not be allowed to drive home.  Name and phone number of your driver:  Special Instructions: coughing and deep breathing exercises, leg exercises               Please read over the following fact sheets you were given: _____________________________________________________________________             [facility] - Preparing for Surgery Before surgery, you can play an important role.  Because skin is not sterile, your skin needs to be as free of germs as possible.  You can reduce the number of germs on your skin by washing with CHG (chlorahexidine gluconate) soap before surgery.  CHG is an antiseptic cleaner which kills germs and bonds with the skin to continue killing germs even after washing. Please DO NOT use if you have an allergy to CHG or antibacterial soaps.  If your skin becomes reddened/irritated stop using  the CHG and inform your nurse when you arrive at Short Stay. Do not shave (including legs and underarms) for at least 48 hours prior to the first CHG shower.  You may shave your face/neck. Please follow these instructions carefully:  1.  Shower with CHG Soap the night before surgery and the  morning of Surgery.  2.  If you choose to wash your hair, wash your hair first as usual with your  normal  shampoo.  3.  After you shampoo, rinse your hair and body thoroughly to remove the  shampoo.                           4.  Use CHG as you would any other liquid soap.  You can apply chg directly  to the skin and wash  Gently with a scrungie or clean washcloth.  5.  Apply the CHG Soap to your body ONLY FROM THE NECK DOWN.   Do not use on face/ open                           Wound or open sores. Avoid contact with eyes, ears mouth and genitals (private parts).                       Wash face,  Genitals (private parts) with your normal soap.             6.  Wash thoroughly, paying special attention to the area where your surgery  will be performed.  7.  Thoroughly rinse your body with warm water from the neck down.  8.  DO NOT shower/wash with your normal soap after using and rinsing off  the CHG Soap.                9.  Pat yourself dry with a clean towel.            10.  Wear clean pajamas.            11.  Place clean sheets on your bed the night of your first shower and do not  sleep with pets. Day of Surgery : Do not apply any lotions/deodorants the morning of surgery.  Please wear clean clothes to the hospital/surgery center.  FAILURE TO FOLLOW THESE INSTRUCTIONS MAY RESULT IN THE CANCELLATION OF YOUR SURGERY PATIENT SIGNATURE_________________________________  NURSE SIGNATURE__________________________________  ________________________________________________________________________

## 2015-12-21 ENCOUNTER — Encounter (HOSPITAL_COMMUNITY): Payer: Self-pay

## 2015-12-21 ENCOUNTER — Encounter (HOSPITAL_COMMUNITY)
Admission: RE | Admit: 2015-12-21 | Discharge: 2015-12-21 | Disposition: A | Discharge status: Home / Self Care | Payer: BLUE CROSS/BLUE SHIELD | Source: Ambulatory Visit | Attending: General Surgery | Admitting: General Surgery

## 2015-12-21 DIAGNOSIS — Z01812 Encounter for preprocedural laboratory examination: Secondary | ICD-10-CM | POA: Diagnosis present

## 2015-12-21 HISTORY — DX: Headache, unspecified: R51.9

## 2015-12-21 HISTORY — DX: Gastro-esophageal reflux disease without esophagitis: K21.9

## 2015-12-21 HISTORY — DX: Headache: R51

## 2015-12-21 LAB — COMPREHENSIVE METABOLIC PANEL
ALBUMIN: 4.5 g/dL (ref 3.5–5.0)
ALK PHOS: 61 U/L (ref 38–126)
ALT: 33 U/L (ref 14–54)
AST: 27 U/L (ref 15–41)
Anion gap: 8 (ref 5–15)
BILIRUBIN TOTAL: 0.9 mg/dL (ref 0.3–1.2)
BUN: 11 mg/dL (ref 6–20)
CO2: 26 mmol/L (ref 22–32)
CREATININE: 0.73 mg/dL (ref 0.44–1.00)
Calcium: 9.3 mg/dL (ref 8.9–10.3)
Chloride: 108 mmol/L (ref 101–111)
GFR calc Af Amer: >60 mL/min (ref >60)
GLUCOSE: 99 mg/dL (ref 65–99)
Potassium: 4.1 mmol/L (ref 3.5–5.1)
Sodium: 142 mmol/L (ref 135–145)
TOTAL PROTEIN: 7.2 g/dL (ref 6.5–8.1)

## 2015-12-21 LAB — CBC WITH DIFFERENTIAL/PLATELET
BASOS ABS: 0 10*3/uL (ref 0.0–0.1)
BASOS PCT: 1 %
Eosinophils Absolute: 0.1 10*3/uL (ref 0.0–0.7)
Eosinophils Relative: 2 %
HEMATOCRIT: 43.5 % (ref 36.0–46.0)
HEMOGLOBIN: 14.7 g/dL (ref 12.0–15.0)
LYMPHS PCT: 27 %
Lymphs Abs: 1.7 10*3/uL (ref 0.7–4.0)
MCH: 30.8 pg (ref 26.0–34.0)
MCHC: 33.8 g/dL (ref 30.0–36.0)
MCV: 91.2 fL (ref 78.0–100.0)
MONO ABS: 0.5 10*3/uL (ref 0.1–1.0)
Monocytes Relative: 8 %
NEUTROS ABS: 3.9 10*3/uL (ref 1.7–7.7)
NEUTROS PCT: 62 %
Platelets: 274 10*3/uL (ref 150–400)
RBC: 4.77 MIL/uL (ref 3.87–5.11)
RDW: 12.5 % (ref 11.5–15.5)
WBC: 6.3 10*3/uL (ref 4.0–10.5)

## 2015-12-21 LAB — HCG, SERUM, QUALITATIVE: PREG SERUM: NEGATIVE

## 2015-12-21 NOTE — Progress Notes (Signed)
EKG-10/2015 in [facility]

## 2015-12-25 ENCOUNTER — Encounter (HOSPITAL_COMMUNITY)
Admission: RE | Disposition: A | Discharge status: Home / Self Care | Payer: Self-pay | Source: Ambulatory Visit | Attending: General Surgery

## 2015-12-25 ENCOUNTER — Ambulatory Visit (HOSPITAL_COMMUNITY)
Admission: RE | Admit: 2015-12-25 | Discharge: 2015-12-25 | Disposition: A | Discharge status: Home / Self Care | Payer: BLUE CROSS/BLUE SHIELD | Source: Ambulatory Visit | Attending: General Surgery | Admitting: General Surgery

## 2015-12-25 ENCOUNTER — Ambulatory Visit (HOSPITAL_COMMUNITY): Payer: BLUE CROSS/BLUE SHIELD | Admitting: Certified Registered Nurse Anesthetist

## 2015-12-25 ENCOUNTER — Encounter (HOSPITAL_COMMUNITY): Payer: Self-pay | Admitting: *Deleted

## 2015-12-25 DIAGNOSIS — Z79899 Other long term (current) drug therapy: Secondary | ICD-10-CM | POA: Insufficient documentation

## 2015-12-25 DIAGNOSIS — Z87891 Personal history of nicotine dependence: Secondary | ICD-10-CM | POA: Diagnosis not present

## 2015-12-25 DIAGNOSIS — K219 Gastro-esophageal reflux disease without esophagitis: Secondary | ICD-10-CM | POA: Insufficient documentation

## 2015-12-25 DIAGNOSIS — K811 Chronic cholecystitis: Secondary | ICD-10-CM | POA: Insufficient documentation

## 2015-12-25 DIAGNOSIS — R1011 Right upper quadrant pain: Secondary | ICD-10-CM | POA: Diagnosis present

## 2015-12-25 DIAGNOSIS — K429 Umbilical hernia without obstruction or gangrene: Secondary | ICD-10-CM | POA: Diagnosis not present

## 2015-12-25 HISTORY — PX: CHOLECYSTECTOMY: SHX55

## 2015-12-25 SURGERY — LAPAROSCOPIC CHOLECYSTECTOMY
Anesthesia: General

## 2015-12-25 MED ORDER — SUCCINYLCHOLINE CHLORIDE 20 MG/ML IJ SOLN
INTRAMUSCULAR | Status: DC | PRN
  Administered 2015-12-25: 100 mg via INTRAVENOUS

## 2015-12-25 MED ORDER — PROPOFOL 10 MG/ML IV BOLUS
INTRAVENOUS | Status: AC
  Filled 2015-12-25: qty 20

## 2015-12-25 MED ORDER — LACTATED RINGERS IV SOLN
INTRAVENOUS | Status: DC
  Administered 2015-12-25: 10:00:00 via INTRAVENOUS
  Administered 2015-12-25: 1000 mL via INTRAVENOUS

## 2015-12-25 MED ORDER — CIPROFLOXACIN IN D5W 400 MG/200ML IV SOLN
INTRAVENOUS | Status: AC
  Filled 2015-12-25: qty 200

## 2015-12-25 MED ORDER — ONDANSETRON HCL 4 MG/2ML IJ SOLN
INTRAMUSCULAR | Status: AC
  Filled 2015-12-25: qty 2

## 2015-12-25 MED ORDER — OXYCODONE-ACETAMINOPHEN 10-325 MG PO TABS: 1.0000 | ORAL_TABLET | Freq: Four times a day (QID) | ORAL | Status: DC | PRN

## 2015-12-25 MED ORDER — MIDAZOLAM HCL 2 MG/2ML IJ SOLN
INTRAMUSCULAR | Status: AC
  Filled 2015-12-25: qty 2

## 2015-12-25 MED ORDER — PROPOFOL 10 MG/ML IV BOLUS
INTRAVENOUS | Status: DC | PRN
  Administered 2015-12-25: 150 mg via INTRAVENOUS

## 2015-12-25 MED ORDER — LACTATED RINGERS IR SOLN
Status: DC | PRN
  Administered 2015-12-25: 1000 mL

## 2015-12-25 MED ORDER — MIDAZOLAM HCL 5 MG/5ML IJ SOLN
INTRAMUSCULAR | Status: DC | PRN
  Administered 2015-12-25: 2 mg via INTRAVENOUS

## 2015-12-25 MED ORDER — SODIUM CHLORIDE 0.9 % IV SOLN: INTRAVENOUS | Status: DC

## 2015-12-25 MED ORDER — CIPROFLOXACIN IN D5W 400 MG/200ML IV SOLN
400.0000 mg | INTRAVENOUS | Status: AC
  Administered 2015-12-25: 400 mg via INTRAVENOUS

## 2015-12-25 MED ORDER — FENTANYL CITRATE (PF) 250 MCG/5ML IJ SOLN
INTRAMUSCULAR | Status: AC
  Filled 2015-12-25: qty 5

## 2015-12-25 MED ORDER — OXYCODONE HCL 5 MG PO TABS: 5.0000 mg | ORAL_TABLET | ORAL | Status: DC | PRN

## 2015-12-25 MED ORDER — FENTANYL CITRATE (PF) 100 MCG/2ML IJ SOLN
INTRAMUSCULAR | Status: AC
  Filled 2015-12-25: qty 2

## 2015-12-25 MED ORDER — LIDOCAINE HCL (CARDIAC) 20 MG/ML IV SOLN
INTRAVENOUS | Status: AC
  Filled 2015-12-25: qty 5

## 2015-12-25 MED ORDER — LIDOCAINE HCL (CARDIAC) 20 MG/ML IV SOLN
INTRAVENOUS | Status: DC | PRN
  Administered 2015-12-25: 60 mg via INTRAVENOUS

## 2015-12-25 MED ORDER — SODIUM CHLORIDE 0.9 % IJ SOLN
INTRAMUSCULAR | Status: AC
  Filled 2015-12-25: qty 10

## 2015-12-25 MED ORDER — OXYCODONE HCL 5 MG PO TABS
5.0000 mg | ORAL_TABLET | ORAL | Status: DC | PRN
Start: 2015-12-25 — End: 2015-12-25
  Administered 2015-12-25: 5 mg via ORAL
  Filled 2015-12-25: qty 1

## 2015-12-25 MED ORDER — DEXAMETHASONE SODIUM PHOSPHATE 10 MG/ML IJ SOLN
INTRAMUSCULAR | Status: AC
  Filled 2015-12-25: qty 1

## 2015-12-25 MED ORDER — ACETAMINOPHEN 325 MG PO TABS: 650.0000 mg | ORAL_TABLET | ORAL | Status: DC | PRN

## 2015-12-25 MED ORDER — ACETAMINOPHEN 650 MG RE SUPP
650.0000 mg | RECTAL | Status: DC | PRN
  Filled 2015-12-25: qty 1

## 2015-12-25 MED ORDER — HYDROMORPHONE HCL 1 MG/ML IJ SOLN
INTRAMUSCULAR | Status: AC
  Filled 2015-12-25: qty 1

## 2015-12-25 MED ORDER — DEXAMETHASONE SODIUM PHOSPHATE 10 MG/ML IJ SOLN
INTRAMUSCULAR | Status: DC | PRN
  Administered 2015-12-25: 10 mg via INTRAVENOUS

## 2015-12-25 MED ORDER — FENTANYL CITRATE (PF) 100 MCG/2ML IJ SOLN
INTRAMUSCULAR | Status: DC | PRN
  Administered 2015-12-25 (×7): 50 ug via INTRAVENOUS

## 2015-12-25 MED ORDER — HYDROMORPHONE HCL 2 MG/ML IJ SOLN
INTRAMUSCULAR | Status: AC
  Filled 2015-12-25: qty 1

## 2015-12-25 MED ORDER — PROCHLORPERAZINE EDISYLATE 5 MG/ML IJ SOLN: 10.0000 mg | Freq: Once | INTRAMUSCULAR | Status: DC | PRN

## 2015-12-25 MED ORDER — 0.9 % SODIUM CHLORIDE (POUR BTL) OPTIME
TOPICAL | Status: DC | PRN
  Administered 2015-12-25: 1000 mL

## 2015-12-25 MED ORDER — HYDROMORPHONE HCL 1 MG/ML IJ SOLN
INTRAMUSCULAR | Status: DC | PRN
  Administered 2015-12-25: .2 mg via INTRAVENOUS
  Administered 2015-12-25 (×2): .4 mg via INTRAVENOUS

## 2015-12-25 MED ORDER — MORPHINE SULFATE (PF) 10 MG/ML IV SOLN: 2.0000 mg | INTRAVENOUS | Status: DC | PRN

## 2015-12-25 MED ORDER — BUPIVACAINE HCL (PF) 0.25 % IJ SOLN
INTRAMUSCULAR | Status: AC
  Filled 2015-12-25: qty 30

## 2015-12-25 MED ORDER — SODIUM CHLORIDE 0.9% FLUSH: 3.0000 mL | INTRAVENOUS | Status: DC | PRN

## 2015-12-25 MED ORDER — SODIUM CHLORIDE 0.9 % IV SOLN: 250.0000 mL | INTRAVENOUS | Status: DC | PRN

## 2015-12-25 MED ORDER — ONDANSETRON HCL 4 MG/2ML IJ SOLN
INTRAMUSCULAR | Status: DC | PRN
Start: 2015-12-25 — End: 2015-12-25
  Administered 2015-12-25: 4 mg via INTRAVENOUS

## 2015-12-25 MED ORDER — SUGAMMADEX SODIUM 200 MG/2ML IV SOLN
INTRAVENOUS | Status: DC | PRN
  Administered 2015-12-25: 200 mg via INTRAVENOUS

## 2015-12-25 MED ORDER — SODIUM CHLORIDE 0.9% FLUSH: 3.0000 mL | Freq: Two times a day (BID) | INTRAVENOUS | Status: DC

## 2015-12-25 MED ORDER — HYDROMORPHONE HCL 1 MG/ML IJ SOLN
0.2500 mg | INTRAMUSCULAR | Status: DC | PRN
  Administered 2015-12-25: 0.5 mg via INTRAVENOUS
  Administered 2015-12-25 (×2): 0.25 mg via INTRAVENOUS
  Administered 2015-12-25: 0.5 mg via INTRAVENOUS

## 2015-12-25 MED ORDER — ROCURONIUM BROMIDE 100 MG/10ML IV SOLN
INTRAVENOUS | Status: DC | PRN
  Administered 2015-12-25: 30 mg via INTRAVENOUS

## 2015-12-25 SURGICAL SUPPLY — 42 items
APL SKNCLS STERI-STRIP NONHPOA (GAUZE/BANDAGES/DRESSINGS)
APPLIER CLIP 5 13 M/L LIGAMAX5 (MISCELLANEOUS) ×3
APPLIER CLIP ROT 10 11.4 M/L (STAPLE)
APR CLP MED LRG 11.4X10 (STAPLE)
APR CLP MED LRG 5 ANG JAW (MISCELLANEOUS) ×1
BAG SPEC RTRVL 10 TROC 200 (ENDOMECHANICALS) ×1
BENZOIN TINCTURE PRP APPL 2/3 (GAUZE/BANDAGES/DRESSINGS) IMPLANT
CLIP APPLIE 5 13 M/L LIGAMAX5 (MISCELLANEOUS) ×1 IMPLANT
CLIP APPLIE ROT 10 11.4 M/L (STAPLE) IMPLANT
CLOSURE WOUND 1/2 X4 (GAUZE/BANDAGES/DRESSINGS)
COVER MAYO STAND STRL (DRAPES) IMPLANT
COVER SURGICAL LIGHT HANDLE (MISCELLANEOUS) ×3 IMPLANT
DECANTER SPIKE VIAL GLASS SM (MISCELLANEOUS) ×1 IMPLANT
DEVICE TROCAR PUNCTURE CLOSURE (ENDOMECHANICALS) ×2 IMPLANT
DRAPE C-ARM 42X120 X-RAY (DRAPES) IMPLANT
DRAPE LAPAROSCOPIC ABDOMINAL (DRAPES) ×3 IMPLANT
ELECT REM PT RETURN 9FT ADLT (ELECTROSURGICAL) ×3
ELECTRODE REM PT RTRN 9FT ADLT (ELECTROSURGICAL) ×1 IMPLANT
GLOVE BIO SURGEON STRL SZ7 (GLOVE) ×3 IMPLANT
GLOVE BIOGEL PI IND STRL 7.5 (GLOVE) ×1 IMPLANT
GLOVE BIOGEL PI INDICATOR 7.5 (GLOVE) ×2
GOWN STRL REUS W/TWL LRG LVL3 (GOWN DISPOSABLE) ×3 IMPLANT
GOWN STRL REUS W/TWL XL LVL3 (GOWN DISPOSABLE) ×6 IMPLANT
KIT BASIN OR (CUSTOM PROCEDURE TRAY) ×3 IMPLANT
LIQUID BAND (GAUZE/BANDAGES/DRESSINGS) ×3 IMPLANT
PAD POSITIONING PINK XL (MISCELLANEOUS) IMPLANT
POSITIONER SURGICAL ARM (MISCELLANEOUS) IMPLANT
POUCH RETRIEVAL ECOSAC 10 (ENDOMECHANICALS) ×1 IMPLANT
POUCH RETRIEVAL ECOSAC 10MM (ENDOMECHANICALS) ×2
SET CHOLANGIOGRAPH MIX (MISCELLANEOUS) IMPLANT
SET IRRIG TUBING LAPAROSCOPIC (IRRIGATION / IRRIGATOR) ×3 IMPLANT
SLEEVE XCEL OPT CAN 5 100 (ENDOMECHANICALS) ×6 IMPLANT
STRIP CLOSURE SKIN 1/2X4 (GAUZE/BANDAGES/DRESSINGS) IMPLANT
SUT MNCRL AB 4-0 PS2 18 (SUTURE) ×3 IMPLANT
SUT VICRYL 0 UR6 27IN ABS (SUTURE) ×2 IMPLANT
TAPE CLOTH 4X10 WHT NS (GAUZE/BANDAGES/DRESSINGS) IMPLANT
TOWEL OR 17X26 10 PK STRL BLUE (TOWEL DISPOSABLE) ×3 IMPLANT
TOWEL OR NON WOVEN STRL DISP B (DISPOSABLE) ×3 IMPLANT
TRAY LAPAROSCOPIC (CUSTOM PROCEDURE TRAY) ×3 IMPLANT
TROCAR BLADELESS OPT 5 100 (ENDOMECHANICALS) ×7 IMPLANT
TROCAR XCEL BLUNT TIP 100MML (ENDOMECHANICALS) ×3 IMPLANT
TROCAR XCEL NON-BLD 11X100MML (ENDOMECHANICALS) IMPLANT

## 2015-12-25 NOTE — Transfer of Care (Signed)
Immediate Anesthesia Transfer of Care Note  Patient: Brandi Sutton  Procedure(s) Performed: Procedure(s): LAPAROSCOPIC CHOLECYSTECTOMY (N/A)  Patient Location: PACU  Anesthesia Type:General  Level of Consciousness:  sedated, patient cooperative and responds to stimulation  Airway & Oxygen Therapy:Patient Spontanous Breathing and Patient connected to face mask oxgen  Post-op Assessment:  Report given to PACU RN and Post -op Vital signs reviewed and stable  Post vital signs:  Reviewed and stable  Last Vitals:  Filed Vitals:   12/25/15 0734 12/25/15 1054  BP: 116/61 142/88  Pulse: 74 95  Temp: 36.4 C 36.4 C  Resp: 16 18    Complications: No apparent anesthesia complications

## 2015-12-25 NOTE — Discharge Instructions (Signed)
[facility] LAPAROSCOPIC SURGERY: POST OP INSTRUCTIONS  Always review your discharge instruction sheet given to you by the facility where your surgery was performed. IF YOU HAVE DISABILITY OR FAMILY LEAVE FORMS, YOU MUST BRING THEM TO THE OFFICE FOR PROCESSING.   DO NOT GIVE THEM TO YOUR DOCTOR.  1. A prescription for pain medication may be given to you upon discharge.  Take your pain medication as prescribed, if needed.  If narcotic pain medicine is not needed, then you may take acetaminophen (Tylenol), naprosyn (Alleve), or ibuprofen (Advil) as needed. 2. Take your usually prescribed medications unless otherwise directed. 3. If you need a refill on your pain medication, please contact your pharmacy.  They will contact our office to request authorization. Prescriptions will not be filled after 5pm or on week-ends. 4. You should follow a light diet the first few days after arrival home, such as soup and crackers, etc.  Be sure to include lots of fluids daily. 5. Most patients will experience some swelling and bruising in the area of the incisions.  Ice packs will help.  Swelling and bruising can take several days to resolve.  6. It is common to experience some constipation if taking pain medication after surgery.  Increasing fluid intake and taking a stool softener (such as Colace) will usually help or prevent this problem from occurring.  A mild laxative (Milk of Magnesia or Miralax) should be taken according to package instructions if there are no bowel movements after 48 hours. 7. Unless discharge instructions indicate otherwise, you may remove your bandages 48 hours after surgery, and you may shower at that time.  You may have steri-strips (small skin tapes) in place directly over the incision.  These strips should be left on the skin for 7-10 days.  If your surgeon used skin glue on the incision, you may shower in 24 hours.  The glue will flake  off over the next 2-3 weeks.  Any sutures or staples will be removed at the office during your follow-up visit. 8. ACTIVITIES:  You may resume regular (light) daily activities beginning the next day--such as daily self-care, walking, climbing stairs--gradually increasing activities as tolerated.  You may have sexual intercourse when it is comfortable.  Refrain from any heavy lifting or straining until approved by your doctor. a. You may drive when you are no longer taking prescription pain medication, you can comfortably wear a seatbelt, and you can safely maneuver your car and apply brakes. b. RETURN TO WORK:  __________________________________________________________ 9. You should see your doctor in the office for a follow-up appointment approximately 2-3 weeks after your surgery.  Make sure that you call for this appointment within a day or two after you arrive home to insure a convenient appointment time. 10. OTHER INSTRUCTIONS: __________________________________________________________________________________________________________________________ __________________________________________________________________________________________________________________________ WHEN TO CALL YOUR DOCTOR: 1. Fever over 101.0 2. Inability to urinate 3. Continued bleeding from incision. 4. Increased pain, redness, or drainage from the incision. 5. Increasing abdominal pain  The clinic staff is available to answer your questions during regular business hours.  Please dont hesitate to call and ask to speak to one of the nurses for clinical concerns.  If you have a medical emergency, go to the nearest emergency room or call 911.  A surgeon from [facility] is always on call at the hospital. [address], [place], [place], [place]  [address] ? [address], [place], [place]   [address] [phone] ? [phone] ? FAX [phone]  [phone] Web site:  [email or web address]      General Anesthesia, Adult, Care After Refer to this sheet in the next few weeks. These instructions provide you with information on caring for yourself after your procedure. Your health care provider may also give you more specific instructions. Your treatment has been planned according to current medical practices, but problems sometimes occur. Call your health care provider if you have any problems or questions after your procedure. WHAT TO EXPECT AFTER THE PROCEDURE After the procedure, it is typical to experience:  Sleepiness.  Nausea and vomiting. HOME CARE INSTRUCTIONS  For the first 24 hours after general anesthesia:  Have a responsible person with you.  Do not drive a car. If you are alone, do not take public transportation.  Do not drink alcohol.  Do not take medicine that has not been prescribed by your health care provider.  Do not sign important papers or make important decisions.  You may resume a normal diet and activities as directed by your health care provider.  Change bandages (dressings) as directed.  If you have questions or problems that seem related to general anesthesia, call the hospital and ask for the anesthetist or anesthesiologist on call. SEEK MEDICAL CARE IF:  You have nausea and vomiting that continue the day after anesthesia.  You develop a rash. SEEK IMMEDIATE MEDICAL CARE IF:   You have difficulty breathing.  You have chest pain.  You have any allergic problems.   This information is not intended to replace advice given to you by your health care provider. Make sure you discuss any questions you have with your health care provider.   Document Released: 11/03/2000 Document Revised: 08/18/2014 Document Reviewed: 11/26/2011 Elsevier Interactive Patient Education [employer].

## 2015-12-25 NOTE — Anesthesia Preprocedure Evaluation (Addendum)
Anesthesia Evaluation  Patient identified by MRN, date of birth, ID band Patient awake    Reviewed: Allergy & Precautions, NPO status , Patient's Chart, lab work & pertinent test results  Airway Mallampati: II  TM Distance: >3 FB Neck ROM: Full    Dental no notable dental hx.    Pulmonary asthma , former smoker,    Pulmonary exam normal breath sounds clear to auscultation       Cardiovascular negative cardio ROS Normal cardiovascular exam Rhythm:Regular Rate:Normal     Neuro/Psych  Headaches, PSYCHIATRIC DISORDERS Depression    GI/Hepatic Neg liver ROS, GERD  Medicated,  Endo/Other  negative endocrine ROS  Renal/GU negative Renal ROS  negative genitourinary   Musculoskeletal negative musculoskeletal ROS (+)   Abdominal   Peds negative pediatric ROS (+)  Hematology negative hematology ROS (+)   Anesthesia Other Findings   Reproductive/Obstetrics negative OB ROS                             Anesthesia Physical Anesthesia Plan  ASA: II  Anesthesia Plan: General   Post-op Pain Management:    Induction: Intravenous  Airway Management Planned: Oral ETT  Additional Equipment:   Intra-op Plan:   Post-operative Plan: Extubation in OR  Informed Consent: I have reviewed the patients History and Physical, chart, labs and discussed the procedure including the risks, benefits and alternatives for the proposed anesthesia with the patient or authorized representative who has indicated his/her understanding and acceptance.   Dental advisory given  Plan Discussed with: CRNA  Anesthesia Plan Comments:         Anesthesia Quick Evaluation

## 2015-12-25 NOTE — Anesthesia Procedure Notes (Signed)
Procedure Name: Intubation Date/Time: 12/25/2015 9:47 AM Performed by: West Pugh Pre-anesthesia Checklist: Patient identified, Emergency Drugs available, Suction available, Patient being monitored and Timeout performed Patient Re-evaluated:Patient Re-evaluated prior to inductionOxygen Delivery Method: Circle system utilized Preoxygenation: Pre-oxygenation with 100% oxygen Intubation Type: IV induction Ventilation: Mask ventilation without difficulty Laryngoscope Size: Mac and 4 Grade View: Grade II Tube type: Oral Number of attempts: 1 Airway Equipment and Method: Stylet Placement Confirmation: ETT inserted through vocal cords under direct vision,  positive ETCO2,  CO2 detector and breath sounds checked- equal and bilateral Secured at: 20 cm Tube secured with: Tape Dental Injury: Teeth and Oropharynx as per pre-operative assessment  Comments: CRNA and MD present for induction and directly supervised Paramedic student for intubation. Attempt X 1 with success.

## 2015-12-25 NOTE — Interval H&P Note (Signed)
History and Physical Interval Note:  12/25/2015 9:04 AM  Brandi Sutton  has presented today for surgery, with the diagnosis of bilinary colic  The various methods of treatment have been discussed with the patient and family. After consideration of risks, benefits and other options for treatment, the patient has consented to  Procedure(s): LAPAROSCOPIC CHOLECYSTECTOMY (N/A) as a surgical intervention .  The patient's history has been reviewed, patient examined, no change in status, stable for surgery.  I have reviewed the patient's chart and labs.  Questions were answered to the patient's satisfaction.     Anquan Azzarello

## 2015-12-25 NOTE — H&P (Signed)
40 yof who is otherwise healthy presents with ruq pain since February. this pain occurs after eating and is always ruq pain and radiates to her back. it sometimes is associated with nausea. this is much worse with fatty foods. it happens daily now. she underwent US that was negative. she has undergone egd with Dr Loletha Carrow that is essentially negative as well. she is here today to discuss options.   Other Problems Marjean Donna, CMA; 12/13/2015 3:18 PM) Asthma Back Pain  Past Surgical History Marjean Donna, CMA; 12/13/2015 3:18 PM) Oral Surgery Tonsillectomy  Diagnostic Studies History Marjean Donna, CMA; 12/13/2015 3:18 PM) Colonoscopy never Pap Smear never  Allergies (Sonya Bynum, CMA; 12/13/2015 3:19 PM) No Known Drug Allergies05/11/2015  Medication History (Sonya Bynum, CMA; 12/13/2015 3:20 PM) Pantoprazole Sodium ('40MG'$  Tablet DR, Oral) Active. Vitamin E (100UNIT Capsule, Oral) Active. Lansoprazole ('15MG'$  Capsule DR, Oral) Active. Vitamin C ('100MG'$  Tablet, Oral) Active. Flonase Allergy Relief (50MCG/ACT Suspension, Nasal as needed) Active. Medications Reconciled  Social History ([place]arth[place]omew; 12/13/2015 3:18 PM) Alcohol use Occasional alcohol use. Caffeine use Coffee. No drug use Tobacco use Former smoker.  Family History Marjean Donna, [place]; 12/13/2015 3:18 PM) Arthritis Mother. Bleeding disorder Mother. Cerebrovascular Accident Mother. Colon Polyps Father, Mother. Depression Mother. Diabetes Mellitus Father, Mother. Hypertension Father. Melanoma Father, Mother.  Pregnancy / Birth History Marjean Donna, [place]; 12/13/2015 3:18 PM) Age at menarche 42 years. Gravida 1 Irregular periods Maternal age 60-25 Para 1   Review of Systems ([place]; 12/13/2015 3:18 PM) General Present- Appetite Loss and Fatigue. Not Present- Chills, Fever, Night Sweats, Weight Gain and Weight Loss. Skin Not Present- Change in Wart/Mole, Dryness, Hives, Jaundice, New  Lesions, Non-Healing Wounds, Rash and Ulcer. HEENT Present- Seasonal Allergies, Sinus Pain and Wears glasses/contact lenses. Not Present- Earache, Hearing Loss, Hoarseness, Nose Bleed, Oral Ulcers, Ringing in the Ears, Sore Throat, Visual Disturbances and Yellow Eyes. Respiratory Not Present- Bloody sputum, Chronic Cough, Difficulty Breathing, Snoring and Wheezing. Breast Not Present- Breast Mass, Breast Pain, Nipple Discharge and Skin Changes. Cardiovascular Not Present- Chest Pain, Difficulty Breathing Lying Down, Leg Cramps, Palpitations, Rapid Heart Rate, Shortness of Breath and Swelling of Extremities. Gastrointestinal Present- Abdominal Pain. Not Present- Bloating, Bloody Stool, Change in Bowel Habits, Chronic diarrhea, Constipation, Difficulty Swallowing, Excessive gas, Gets full quickly at meals, Hemorrhoids, Indigestion, Nausea, Rectal Pain and Vomiting. Female Genitourinary Not Present- Frequency, Nocturia, Painful Urination, Pelvic Pain and Urgency. Musculoskeletal Present- Back Pain. Not Present- Joint Pain, Joint Stiffness, Muscle Pain, Muscle Weakness and Swelling of Extremities. Neurological Not Present- Decreased Memory, Fainting, Headaches, Numbness, Seizures, Tingling, Tremor, Trouble walking and Weakness. Psychiatric Not Present- Anxiety, Bipolar, Change in Sleep Pattern, Depression, Fearful and Frequent crying. Endocrine Not Present- Cold Intolerance, Excessive Hunger, Hair Changes, Heat Intolerance, Hot flashes and New Diabetes. Hematology Not Present- Easy Bruising, Excessive bleeding, Gland problems, HIV and Persistent Infections.  Vitals (Sonya Bynum CMA; 12/13/2015 3:19 PM) 12/13/2015 3:19 PM Weight: 187 lb Height: 67in Body Surface Area: 1.97 m Body Mass Index: 29.29 kg/m  Temp.: 61F(Temporal)  Pulse: 76 (Regular)  BP: 128/82 (Sitting, Left Arm, Standard) Physical Exam Rolm Bookbinder MD; 12/13/2015 3:39 PM) General Mental  Status-Alert. Orientation-Oriented X3. Eye Sclera/Conjunctiva - Bilateral-No scleral icterus. Chest and Lung Exam Chest and lung exam reveals -on auscultation, normal breath sounds, no adventitious sounds and normal vocal resonance. Cardiovascular Cardiovascular examination reveals -normal heart sounds, regular rate and rhythm with no murmurs. Abdomen Note: mild tender ruq, otherwise soft, nondistended   Assessment & Plan Rodman Key  Vidal Lampkins MD; 12/13/2015 4:54 PM) BILIARY COLIC (U98.11) Story: Laparoscopic cholecystectomy I discussed with negative [place] there is certainly possibility that surgery might not cure any pain that she is having. I dont think any additional testing is needed now either. I discussed the procedure in detail. The patient was given Neurosurgeon. We discussed the risks and benefits of a laparoscopic cholecystectomy and possible cholangiogram including, but not limited to bleeding, infection, injury to surrounding structures such as the intestine or liver, bile leak, retained gallstones, need to convert to an open procedure, prolonged diarrhea, blood clots such as DVT, common bile duct injury, anesthesia risks, and possible need for additional procedures. The likelihood of improvement in symptoms and return to the patient's normal status is good. We discussed the typical post-operative recovery course.

## 2015-12-25 NOTE — Op Note (Signed)
Preoperative diagnosis:biliary colic Postoperative diagnosis: same as above, umbilical hernia Procedure: laparoscopic cholecystectomy, primary umbilical hernia repair Surgeon: Dr Serita Grammes Anesthesia: general EBL: minimal Drains none Specimen gb and contents to pathology Complications: none Sponge count correct at completion Disposition to recovery stable  Indications: This is a 43 yof with symptoms of biliary colic and normal studies. We discussed options and have elected to proceed with lap chole.  She understands this may not relieve her symptoms.   Procedure: After informed consent was obtained the patient was taken to the operating room. She was given antibiotics. Sequential compression devices were on her legs. She was placed under general anesthesia without complication. Her abdomen was prepped and draped in the standard sterile surgical fashion. A surgical timeout was then performed.  I infiltrated marcaine below the umbilicus.  I then made a vertical incision and grasped the fascia. She had a small umbilical hernia present.  I then placed a 0 vicryl pursestring suture and inserted a hasson trocar. The abdomen was insufflated with 15 mm Hg pressure.  The gallblader had some omental attachments that were taken down bluntly.  I was able to grasp the gallbladder and retract it cephalad and lateral. I then obtained the critical view of safety. I clipped and divided the cystic artery.  I treated the anterior and posterior branches separately. I treated the cystic duct in a similar fashion. The duct was viable and the clips traversed the duct.   I then removed the gallbladder from the liver bed and placed it in a bag.  I then obtained hemostasis and irrigated. I then removed the umbilical trocar and closed my pursestring. I also placed 2 other 0 vicryl sutures to completely close the small umbilical hernia as well with the endoclose device.  I then desufflated the abdomen and removed  all my remaining trocars. I then closed these with 4-0 Monocryl and Dermabond. She tolerated this well was extubated and transferred to the recovery room in stable condition

## 2015-12-25 NOTE — Anesthesia Postprocedure Evaluation (Signed)
Anesthesia Post Note  Patient: Brandi Sutton  Procedure(s) Performed: Procedure(s) (LRB): LAPAROSCOPIC CHOLECYSTECTOMY (N/A)  Patient location during evaluation: PACU Anesthesia Type: General Level of consciousness: awake and alert Pain management: pain level controlled Vital Signs Assessment: post-procedure vital signs reviewed and stable Respiratory status: spontaneous breathing, nonlabored ventilation, respiratory function stable and patient connected to nasal cannula oxygen Cardiovascular status: blood pressure returned to baseline and stable Postop Assessment: no signs of nausea or vomiting Anesthetic complications: no    Last Vitals:  Filed Vitals:   12/25/15 1159 12/25/15 1240  BP: 122/68 127/88  Pulse: 64 77  Temp: 36.4 C 36.8 C  Resp: 16 18    Last Pain:  Filed Vitals:   12/25/15 1242  PainSc: 1                  Tena Linebaugh J

## 2016-04-30 ENCOUNTER — Encounter: Payer: Self-pay | Admitting: Gastroenterology

## 2016-04-30 ENCOUNTER — Ambulatory Visit (INDEPENDENT_AMBULATORY_CARE_PROVIDER_SITE_OTHER): Payer: BLUE CROSS/BLUE SHIELD | Admitting: Gastroenterology

## 2016-04-30 VITALS — BP 110/86 | HR 82 | Ht 66.5 in | Wt 193.0 lb

## 2016-04-30 DIAGNOSIS — K591 Functional diarrhea: Secondary | ICD-10-CM | POA: Diagnosis not present

## 2016-04-30 DIAGNOSIS — Z8371 Family history of colonic polyps: Secondary | ICD-10-CM

## 2016-04-30 DIAGNOSIS — R1011 Right upper quadrant pain: Secondary | ICD-10-CM | POA: Diagnosis not present

## 2016-04-30 MED ORDER — HYOSCYAMINE SULFATE 0.125 MG SL SUBL: 0.1250 mg | SUBLINGUAL_TABLET | Freq: Four times a day (QID) | SUBLINGUAL | 1 refills | Status: DC | PRN

## 2016-04-30 MED ORDER — NA SULFATE-K SULFATE-MG SULF 17.5-3.13-1.6 GM/177ML PO SOLN: 1.0000 | Freq: Once | ORAL | 0 refills | Status: AC

## 2016-04-30 NOTE — Patient Instructions (Signed)
You have been scheduled for a colonoscopy. Please follow written instructions given to you at your visit today.  Please pick up your prep supplies at the pharmacy within the next 1-3 days. If you use inhalers (even only as needed), please bring them with you on the day of your procedure. Your physician has requested that you go to www.startemmi.com and enter the access code given to you at your visit today. This web site gives a general overview about your procedure. However, you should still follow specific instructions given to you by our office regarding your preparation for the procedure.  We have sent the following medications to your pharmacy for you to pick up at your convenience: Suprep  If you are age 42 or older, your body mass index should be between 23-30. Your Body mass index is 30.68 kg/m. If this is out of the aforementioned range listed, please consider follow up with your Primary Care Provider.  If you are age 69 or younger, your body mass index should be between 19-25. Your Body mass index is 30.68 kg/m. If this is out of the aformentioned range listed, please consider follow up with your Primary Care Provider.   Thank you for choosing [facility]  Dr Wilfrid Lund III

## 2016-04-30 NOTE — Progress Notes (Signed)
[facility] Progress Note  Chief Complaint: fam hx colon polyp  Subjective  History:  Prior RUQ pain, neg EGD and h pylori bx Much better after cholecystectomy with Dr. Donne Hazel. Now occ'l urgency and diarrhea. Occurs unpredictably perhaps once every week to 10 days. Some of this was present before, but seems more pronounced after the surgery. Dad had "non-cancerous" tumor causing obstruction requiring surgery age 42. pathology of this is unknown.  ROS: Cardiovascular:  no chest pain Respiratory: no dyspnea  The patient's Past Medical, Family and Social History were reviewed and are on file in the EMR.  Objective:  Med list reviewed  Vital signs in last 24 hrs: Vitals:   04/30/16 0950  BP: 110/86  Pulse: 82    Physical Exam   HEENT: sclera anicteric, oral mucosa moist without lesions  Neck: supple, no thyromegaly, JVD or lymphadenopathy  Cardiac: RRR without murmurs, S1S2 heard, no peripheral edema  Pulm: clear to auscultation bilaterally, normal RR and effort noted  Abdomen: soft, No tenderness, with active bowel sounds. No guarding or palpable hepatosplenomegaly.  Skin; warm and dry, no jaundice or rash  I reviewed her operative report and surgical pathology of the gallbladder. She had chronic cholecystitis, no stones were found.  '@ASSESSMENTPLANBEGIN'$ @ Assessment: Encounter Diagnoses  Name Primary?  . Family hx colonic polyps Yes  . RUQ pain   . Functional diarrhea   . Gastritis and gastroduodenitis    Her father's pathology is unknown. We do not know if it was an inflammatory or neoplastic process. However, it sounds like it was a severe problem causing bowel obstruction requiring surgery and a temporary colostomy bag. I think we should error on the side of caution assuming that it was neoplasia, and had a should have a screening colonoscopy now. However, if that has no adenomatous polyps, then she can be returned average risk screening with her next exam in  10 years.  Her diarrhea sounds a combination of bile acid diarrhea and some mild underlying IBS.   Plan: Screening colonoscopy for family history of colon polyps  The benefits and risks of the planned procedure were described in detail with the patient or (when appropriate) their health care proxy.  Risks were outlined as including, but not limited to, bleeding, infection, perforation, adverse medication reaction leading to cardiac or pulmonary decompensation, or pancreatitis (if ERCP).  The limitation of incomplete mucosal visualization was also discussed.  No guarantees or warranties were given.  Trial of Levsin for mild functional diarrhea.  Total time 30 minutes, over half spent in counseling and coordination of care.   Nelida Meuse III

## 2016-05-06 ENCOUNTER — Other Ambulatory Visit: Payer: Self-pay | Admitting: Obstetrics and Gynecology

## 2016-05-07 LAB — CYTOLOGY - PAP

## 2016-05-12 ENCOUNTER — Other Ambulatory Visit: Payer: Self-pay | Admitting: Obstetrics and Gynecology

## 2016-05-12 DIAGNOSIS — R928 Other abnormal and inconclusive findings on diagnostic imaging of breast: Secondary | ICD-10-CM

## 2016-05-14 ENCOUNTER — Other Ambulatory Visit: Payer: Self-pay | Admitting: Obstetrics and Gynecology

## 2016-05-14 ENCOUNTER — Ambulatory Visit
Admission: RE | Admit: 2016-05-14 | Discharge: 2016-05-14 | Disposition: A | Discharge status: Home / Self Care | Payer: BLUE CROSS/BLUE SHIELD | Source: Ambulatory Visit | Attending: Obstetrics and Gynecology | Admitting: Obstetrics and Gynecology

## 2016-05-14 DIAGNOSIS — R928 Other abnormal and inconclusive findings on diagnostic imaging of breast: Secondary | ICD-10-CM

## 2016-05-14 DIAGNOSIS — N6320 Unspecified lump in the left breast, unspecified quadrant: Secondary | ICD-10-CM

## 2016-05-14 DIAGNOSIS — R921 Mammographic calcification found on diagnostic imaging of breast: Secondary | ICD-10-CM

## 2016-05-16 ENCOUNTER — Ambulatory Visit
Admission: RE | Admit: 2016-05-16 | Discharge: 2016-05-16 | Disposition: A | Discharge status: Home / Self Care | Payer: BLUE CROSS/BLUE SHIELD | Source: Ambulatory Visit | Attending: Obstetrics and Gynecology | Admitting: Obstetrics and Gynecology

## 2016-05-16 DIAGNOSIS — N6320 Unspecified lump in the left breast, unspecified quadrant: Secondary | ICD-10-CM

## 2016-05-16 DIAGNOSIS — R921 Mammographic calcification found on diagnostic imaging of breast: Secondary | ICD-10-CM

## 2016-06-05 ENCOUNTER — Other Ambulatory Visit: Payer: Self-pay | Admitting: General Surgery

## 2016-06-05 DIAGNOSIS — N6489 Other specified disorders of breast: Secondary | ICD-10-CM

## 2016-06-11 ENCOUNTER — Encounter: Payer: Self-pay | Admitting: Gastroenterology

## 2016-06-13 ENCOUNTER — Other Ambulatory Visit: Payer: Self-pay | Admitting: General Surgery

## 2016-06-13 DIAGNOSIS — N6489 Other specified disorders of breast: Secondary | ICD-10-CM

## 2016-06-18 ENCOUNTER — Encounter: Payer: BLUE CROSS/BLUE SHIELD | Admitting: Gastroenterology

## 2016-06-20 ENCOUNTER — Ambulatory Visit (AMBULATORY_SURGERY_CENTER): Payer: BLUE CROSS/BLUE SHIELD | Admitting: Gastroenterology

## 2016-06-20 ENCOUNTER — Encounter: Payer: Self-pay | Admitting: Gastroenterology

## 2016-06-20 VITALS — BP 110/69 | HR 70 | Temp 98.2°F | Resp 12 | Ht 66.0 in | Wt 193.0 lb

## 2016-06-20 DIAGNOSIS — Z8371 Family history of colonic polyps: Secondary | ICD-10-CM | POA: Diagnosis not present

## 2016-06-20 DIAGNOSIS — Z1212 Encounter for screening for malignant neoplasm of rectum: Secondary | ICD-10-CM | POA: Diagnosis not present

## 2016-06-20 DIAGNOSIS — Z1211 Encounter for screening for malignant neoplasm of colon: Secondary | ICD-10-CM

## 2016-06-20 MED ORDER — SODIUM CHLORIDE 0.9 % IV SOLN: 500.0000 mL | INTRAVENOUS | Status: DC

## 2016-06-20 NOTE — Patient Instructions (Signed)
YOU HAD AN ENDOSCOPIC PROCEDURE TODAY AT [facility]:   Refer to the procedure report that was given to you for any specific questions about what was found during the examination.  If the procedure report does not answer your questions, please call your gastroenterologist to clarify.  If you requested that your care partner not be given the details of your procedure findings, then the procedure report has been included in a sealed envelope for you to review at your convenience later.  YOU SHOULD EXPECT: Some feelings of bloating in the abdomen. Passage of more gas than usual.  Walking can help get rid of the air that was put into your GI tract during the procedure and reduce the bloating. If you had a lower endoscopy (such as a colonoscopy or flexible sigmoidoscopy) you may notice spotting of blood in your stool or on the toilet paper. If you underwent a bowel prep for your procedure, you may not have a normal bowel movement for a few days.  Please Note:  You might notice some irritation and congestion in your nose or some drainage.  This is from the oxygen used during your procedure.  There is no need for concern and it should clear up in a day or so.  SYMPTOMS TO REPORT IMMEDIATELY:   Following lower endoscopy (colonoscopy or flexible sigmoidoscopy):  Excessive amounts of blood in the stool  Significant tenderness or worsening of abdominal pains  Swelling of the abdomen that is new, acute  Fever of 100F or higher For urgent or emergent issues, a gastroenterologist can be reached at any hour by calling [phone].  DIET:  We do recommend a small meal at first, but then you may proceed to your regular diet.  Drink plenty of fluids but you should avoid alcoholic beverages for 24 hours.  ACTIVITY:  You should plan to take it easy for the rest of today and you should NOT DRIVE or use heavy machinery until tomorrow (because of the sedation medicines used during the test).     FOLLOW UP: Our staff will call the number listed on your records the next business day following your procedure to check on you and address any questions or concerns that you may have regarding the information given to you following your procedure. If we do not reach you, we will leave a message.  However, if you are feeling well and you are not experiencing any problems, there is no need to return our call.  We will assume that you have returned to your regular daily activities without incident.  SIGNATURES/CONFIDENTIALITY: You and/or your care partner have signed paperwork which will be entered into your electronic medical record.  These signatures attest to the fact that that the information above on your After Visit Summary has been reviewed and is understood.  Full responsibility of the confidentiality of this discharge information lies with you and/or your care-partner.  Next colonoscopy- 10 years  Please read over handout about diverticulosis  Continue your normal medications

## 2016-06-20 NOTE — Progress Notes (Signed)
To re covery, report to Monday, RN

## 2016-06-20 NOTE — Op Note (Signed)
[place] Patient Name: Brandi Sutton Procedure Date: 06/20/2016 1:17 PM MRN: [id number] Endoscopist: Mallie Mussel L. Loletha Carrow , MD Age: 40 Referring MD:  Date of Birth: [birth year] Gender: Female Account #: [id number] Procedure:                Colonoscopy Indications:              Colon cancer screening in patient at increased                            risk: Family history of 1st-degree relative with                            colon polyp requiring surgery - pathology unknown Medicines:                Monitored Anesthesia Care Procedure:                Pre-Anesthesia Assessment:                           - Prior to the procedure, a History and Physical                            was performed, and patient medications and                            allergies were reviewed. The patient's tolerance of                            previous anesthesia was also reviewed. The risks                            and benefits of the procedure and the sedation                            options and risks were discussed with the patient.                            All questions were answered, and informed consent                            was obtained. Prior Anticoagulants: The patient has                            taken no previous anticoagulant or antiplatelet                            agents. ASA Grade Assessment: II - A patient with                            mild systemic disease. After reviewing the risks                            and benefits, the patient was deemed in  satisfactory condition to undergo the procedure.                           After obtaining informed consent, the colonoscope                            was passed under direct vision. Throughout the                            procedure, the patient's blood pressure, pulse, and                            oxygen saturations were monitored continuously. The                            Model CF-[id number]  [id number]) scope was introduced                            through the anus and advanced to the the cecum,                            identified by appendiceal orifice and ileocecal                            valve. The ileocecal valve, appendiceal orifice,                            and rectum were photographed. The quality of the                            bowel preparation was excellent. The colonoscopy                            was performed without difficulty. The patient                            tolerated the procedure well. The bowel preparation                            used was SUPREP. Scope In: 1:24:14 PM Scope Out: 1:35:32 PM Scope Withdrawal Time: 0 hours 7 minutes 37 seconds  Total Procedure Duration: 0 hours 11 minutes 18 seconds  Findings:                 Multiple diverticula were found in the left colon                            and right colon.                           The exam was otherwise without abnormality on                            direct and retroflexion views. Complications:            No  immediate complications. Estimated blood loss:                            None. Estimated Blood Loss:     Estimated blood loss: none. Recommendation:           - Repeat colonoscopy in 10 years for screening                            purposes.                           - Patient has a contact number available for                            emergencies. The signs and symptoms of potential                            delayed complications were discussed with the                            patient. Return to normal activities tomorrow.                            Written discharge instructions were provided to the                            patient.                           - Resume previous diet.                           - Continue present medications. Kawena Lyday L. Loletha Carrow, MD 06/20/2016 1:40:33 PM This report has been signed electronically.

## 2016-06-23 ENCOUNTER — Telehealth: Payer: Self-pay | Admitting: *Deleted

## 2016-06-23 NOTE — Telephone Encounter (Signed)
  Follow up Call-  Call back number 06/20/2016 11/28/2015  Post procedure Call Back phone  # [phone]  Permission to leave phone message Yes Yes  Some recent data might be hidden     Patient questions:  Do you have a fever, pain , or abdominal swelling? No. Pain Score  0 *  Have you tolerated food without any problems? Yes.    Have you been able to return to your normal activities? Yes.    Do you have any questions about your discharge instructions: Diet   No. Medications  No. Follow up visit  No.  Do you have questions or concerns about your Care? No.  Actions: * If pain score is 4 or above: No action needed, pain <4.

## 2016-07-08 ENCOUNTER — Encounter (HOSPITAL_BASED_OUTPATIENT_CLINIC_OR_DEPARTMENT_OTHER): Payer: Self-pay | Admitting: *Deleted

## 2016-07-11 ENCOUNTER — Ambulatory Visit
Admission: RE | Admit: 2016-07-11 | Discharge: 2016-07-11 | Disposition: A | Discharge status: Home / Self Care | Payer: BLUE CROSS/BLUE SHIELD | Source: Ambulatory Visit | Attending: General Surgery | Admitting: General Surgery

## 2016-07-11 DIAGNOSIS — N6489 Other specified disorders of breast: Secondary | ICD-10-CM

## 2016-07-11 DIAGNOSIS — R928 Other abnormal and inconclusive findings on diagnostic imaging of breast: Secondary | ICD-10-CM | POA: Diagnosis not present

## 2016-07-11 NOTE — Progress Notes (Signed)
Given 8 oz carton of boost breeze to drink at 430 am morning of surgery and no other liquids or food after midnight ,   Teach back ,  Pt voiced understanding

## 2016-07-14 ENCOUNTER — Ambulatory Visit (HOSPITAL_BASED_OUTPATIENT_CLINIC_OR_DEPARTMENT_OTHER)
Admission: RE | Admit: 2016-07-14 | Discharge: 2016-07-14 | Disposition: A | Discharge status: Home / Self Care | Payer: BLUE CROSS/BLUE SHIELD | Source: Ambulatory Visit | Attending: General Surgery | Admitting: General Surgery

## 2016-07-14 ENCOUNTER — Ambulatory Visit (HOSPITAL_BASED_OUTPATIENT_CLINIC_OR_DEPARTMENT_OTHER): Payer: BLUE CROSS/BLUE SHIELD | Admitting: Anesthesiology

## 2016-07-14 ENCOUNTER — Encounter (HOSPITAL_BASED_OUTPATIENT_CLINIC_OR_DEPARTMENT_OTHER): Payer: Self-pay | Admitting: Anesthesiology

## 2016-07-14 ENCOUNTER — Ambulatory Visit
Admission: RE | Admit: 2016-07-14 | Discharge: 2016-07-14 | Disposition: A | Discharge status: Home / Self Care | Payer: BLUE CROSS/BLUE SHIELD | Source: Ambulatory Visit | Attending: General Surgery | Admitting: General Surgery

## 2016-07-14 ENCOUNTER — Encounter (HOSPITAL_BASED_OUTPATIENT_CLINIC_OR_DEPARTMENT_OTHER)
Admission: RE | Disposition: A | Discharge status: Home / Self Care | Payer: Self-pay | Source: Ambulatory Visit | Attending: General Surgery

## 2016-07-14 DIAGNOSIS — N6489 Other specified disorders of breast: Secondary | ICD-10-CM | POA: Diagnosis not present

## 2016-07-14 DIAGNOSIS — Z87891 Personal history of nicotine dependence: Secondary | ICD-10-CM | POA: Diagnosis not present

## 2016-07-14 DIAGNOSIS — R921 Mammographic calcification found on diagnostic imaging of breast: Secondary | ICD-10-CM | POA: Diagnosis not present

## 2016-07-14 DIAGNOSIS — R928 Other abnormal and inconclusive findings on diagnostic imaging of breast: Secondary | ICD-10-CM | POA: Diagnosis not present

## 2016-07-14 DIAGNOSIS — Z880 Allergy status to penicillin: Secondary | ICD-10-CM | POA: Insufficient documentation

## 2016-07-14 DIAGNOSIS — Z803 Family history of malignant neoplasm of breast: Secondary | ICD-10-CM | POA: Diagnosis not present

## 2016-07-14 DIAGNOSIS — N6012 Diffuse cystic mastopathy of left breast: Secondary | ICD-10-CM | POA: Diagnosis not present

## 2016-07-14 DIAGNOSIS — K219 Gastro-esophageal reflux disease without esophagitis: Secondary | ICD-10-CM | POA: Diagnosis not present

## 2016-07-14 DIAGNOSIS — N6022 Fibroadenosis of left breast: Secondary | ICD-10-CM | POA: Insufficient documentation

## 2016-07-14 DIAGNOSIS — J45909 Unspecified asthma, uncomplicated: Secondary | ICD-10-CM | POA: Insufficient documentation

## 2016-07-14 DIAGNOSIS — F329 Major depressive disorder, single episode, unspecified: Secondary | ICD-10-CM | POA: Insufficient documentation

## 2016-07-14 DIAGNOSIS — R739 Hyperglycemia, unspecified: Secondary | ICD-10-CM | POA: Diagnosis not present

## 2016-07-14 HISTORY — DX: Unspecified lump in the left breast, unspecified quadrant: N63.20

## 2016-07-14 HISTORY — PX: RADIOACTIVE SEED GUIDED EXCISIONAL BREAST BIOPSY: SHX6490

## 2016-07-14 HISTORY — DX: Unspecified asthma, uncomplicated: J45.909

## 2016-07-14 SURGERY — RADIOACTIVE SEED GUIDED BREAST BIOPSY
Anesthesia: General | Site: Breast | Laterality: Left

## 2016-07-14 MED ORDER — HYDROMORPHONE HCL 1 MG/ML IJ SOLN
INTRAMUSCULAR | Status: AC
  Filled 2016-07-14: qty 1

## 2016-07-14 MED ORDER — FENTANYL CITRATE (PF) 100 MCG/2ML IJ SOLN
INTRAMUSCULAR | Status: DC | PRN
  Administered 2016-07-14: 100 ug via INTRAVENOUS

## 2016-07-14 MED ORDER — FENTANYL CITRATE (PF) 100 MCG/2ML IJ SOLN: 50.0000 ug | INTRAMUSCULAR | Status: DC | PRN

## 2016-07-14 MED ORDER — HYDROCODONE-ACETAMINOPHEN 10-325 MG PO TABS: 1.0000 | ORAL_TABLET | Freq: Four times a day (QID) | ORAL | 0 refills | Status: DC | PRN

## 2016-07-14 MED ORDER — LACTATED RINGERS IV SOLN
INTRAVENOUS | Status: DC
  Administered 2016-07-14 (×2): via INTRAVENOUS

## 2016-07-14 MED ORDER — PROPOFOL 10 MG/ML IV BOLUS
INTRAVENOUS | Status: AC
  Filled 2016-07-14: qty 20

## 2016-07-14 MED ORDER — SCOPOLAMINE 1 MG/3DAYS TD PT72: 1.0000 | MEDICATED_PATCH | Freq: Once | TRANSDERMAL | Status: DC | PRN

## 2016-07-14 MED ORDER — DEXAMETHASONE SODIUM PHOSPHATE 10 MG/ML IJ SOLN
INTRAMUSCULAR | Status: AC
  Filled 2016-07-14: qty 1

## 2016-07-14 MED ORDER — DEXAMETHASONE SODIUM PHOSPHATE 4 MG/ML IJ SOLN
INTRAMUSCULAR | Status: DC | PRN
  Administered 2016-07-14: 10 mg via INTRAVENOUS

## 2016-07-14 MED ORDER — CIPROFLOXACIN IN D5W 400 MG/200ML IV SOLN
400.0000 mg | INTRAVENOUS | Status: AC
  Administered 2016-07-14: 400 mg via INTRAVENOUS

## 2016-07-14 MED ORDER — ACETAMINOPHEN 500 MG PO TABS
1000.0000 mg | ORAL_TABLET | ORAL | Status: AC
  Administered 2016-07-14: 1000 mg via ORAL

## 2016-07-14 MED ORDER — LIDOCAINE HCL (CARDIAC) 20 MG/ML IV SOLN
INTRAVENOUS | Status: DC | PRN
  Administered 2016-07-14: 30 mg via INTRAVENOUS

## 2016-07-14 MED ORDER — HYDROMORPHONE HCL 1 MG/ML IJ SOLN
0.2500 mg | INTRAMUSCULAR | Status: DC | PRN
  Administered 2016-07-14: 0.5 mg via INTRAVENOUS

## 2016-07-14 MED ORDER — PROMETHAZINE HCL 25 MG/ML IJ SOLN: 6.2500 mg | INTRAMUSCULAR | Status: DC | PRN

## 2016-07-14 MED ORDER — GABAPENTIN 300 MG PO CAPS
ORAL_CAPSULE | ORAL | Status: AC
  Filled 2016-07-14: qty 1

## 2016-07-14 MED ORDER — KETOROLAC TROMETHAMINE 30 MG/ML IJ SOLN: 30.0000 mg | Freq: Once | INTRAMUSCULAR | Status: DC | PRN

## 2016-07-14 MED ORDER — BUPIVACAINE HCL (PF) 0.25 % IJ SOLN
INTRAMUSCULAR | Status: DC | PRN
  Administered 2016-07-14: 10 mL

## 2016-07-14 MED ORDER — ONDANSETRON HCL 4 MG/2ML IJ SOLN
INTRAMUSCULAR | Status: AC
  Filled 2016-07-14: qty 2

## 2016-07-14 MED ORDER — MIDAZOLAM HCL 5 MG/5ML IJ SOLN
INTRAMUSCULAR | Status: DC | PRN
  Administered 2016-07-14: 2 mg via INTRAVENOUS

## 2016-07-14 MED ORDER — PROPOFOL 10 MG/ML IV BOLUS
INTRAVENOUS | Status: DC | PRN
  Administered 2016-07-14: 200 mg via INTRAVENOUS

## 2016-07-14 MED ORDER — CIPROFLOXACIN IN D5W 400 MG/200ML IV SOLN
INTRAVENOUS | Status: AC
  Filled 2016-07-14: qty 200

## 2016-07-14 MED ORDER — ACETAMINOPHEN 500 MG PO TABS
ORAL_TABLET | ORAL | Status: AC
  Filled 2016-07-14: qty 2

## 2016-07-14 MED ORDER — ONDANSETRON HCL 4 MG/2ML IJ SOLN
INTRAMUSCULAR | Status: DC | PRN
  Administered 2016-07-14: 4 mg via INTRAVENOUS

## 2016-07-14 MED ORDER — GABAPENTIN 300 MG PO CAPS
300.0000 mg | ORAL_CAPSULE | ORAL | Status: AC
  Administered 2016-07-14: 300 mg via ORAL

## 2016-07-14 MED ORDER — MIDAZOLAM HCL 2 MG/2ML IJ SOLN: 1.0000 mg | INTRAMUSCULAR | Status: DC | PRN

## 2016-07-14 MED ORDER — LIDOCAINE 2% (20 MG/ML) 5 ML SYRINGE
INTRAMUSCULAR | Status: AC
  Filled 2016-07-14: qty 5

## 2016-07-14 SURGICAL SUPPLY — 56 items
ADH SKN CLS APL DERMABOND .7 (GAUZE/BANDAGES/DRESSINGS) ×1
BINDER BREAST LRG (GAUZE/BANDAGES/DRESSINGS) IMPLANT
BINDER BREAST MEDIUM (GAUZE/BANDAGES/DRESSINGS) IMPLANT
BINDER BREAST XLRG (GAUZE/BANDAGES/DRESSINGS) IMPLANT
BINDER BREAST XXLRG (GAUZE/BANDAGES/DRESSINGS) ×2 IMPLANT
BLADE SURG 15 STRL LF DISP TIS (BLADE) ×1 IMPLANT
BLADE SURG 15 STRL SS (BLADE) ×3
CANISTER SUC SOCK COL 7IN (MISCELLANEOUS) IMPLANT
CANISTER SUCT 1200ML W/VALVE (MISCELLANEOUS) IMPLANT
CHLORAPREP W/TINT 26ML (MISCELLANEOUS) ×3 IMPLANT
CLIP TI WIDE RED SMALL 6 (CLIP) ×2 IMPLANT
CLOSURE WOUND 1/2 X4 (GAUZE/BANDAGES/DRESSINGS) ×1
COVER BACK TABLE 60X90IN (DRAPES) ×3 IMPLANT
COVER MAYO STAND STRL (DRAPES) ×3 IMPLANT
COVER PROBE W GEL 5X96 (DRAPES) ×3 IMPLANT
DECANTER SPIKE VIAL GLASS SM (MISCELLANEOUS) IMPLANT
DERMABOND ADVANCED (GAUZE/BANDAGES/DRESSINGS) ×2
DERMABOND ADVANCED .7 DNX12 (GAUZE/BANDAGES/DRESSINGS) ×1 IMPLANT
DEVICE DUBIN W/COMP PLATE 8390 (MISCELLANEOUS) ×3 IMPLANT
DRAPE LAPAROSCOPIC ABDOMINAL (DRAPES) ×3 IMPLANT
DRAPE UTILITY XL STRL (DRAPES) ×3 IMPLANT
DRSG TEGADERM 4X4.75 (GAUZE/BANDAGES/DRESSINGS) IMPLANT
ELECT COATED BLADE 2.86 ST (ELECTRODE) ×3 IMPLANT
ELECT REM PT RETURN 9FT ADLT (ELECTROSURGICAL) ×3
ELECTRODE REM PT RTRN 9FT ADLT (ELECTROSURGICAL) ×1 IMPLANT
GLOVE BIO SURGEON STRL SZ7 (GLOVE) ×6 IMPLANT
GLOVE BIOGEL PI IND STRL 7.5 (GLOVE) ×1 IMPLANT
GLOVE BIOGEL PI INDICATOR 7.5 (GLOVE) ×2
GOWN STRL REUS W/ TWL LRG LVL3 (GOWN DISPOSABLE) ×2 IMPLANT
GOWN STRL REUS W/TWL LRG LVL3 (GOWN DISPOSABLE) ×6
ILLUMINATOR WAVEGUIDE N/F (MISCELLANEOUS) ×2 IMPLANT
KIT MARKER MARGIN INK (KITS) ×3 IMPLANT
LIGHT WAVEGUIDE WIDE FLAT (MISCELLANEOUS) IMPLANT
NDL HYPO 25X1 1.5 SAFETY (NEEDLE) ×1 IMPLANT
NEEDLE HYPO 25X1 1.5 SAFETY (NEEDLE) ×3 IMPLANT
NS IRRIG 1000ML POUR BTL (IV SOLUTION) ×2 IMPLANT
PACK BASIN DAY SURGERY FS (CUSTOM PROCEDURE TRAY) ×3 IMPLANT
PENCIL BUTTON HOLSTER BLD 10FT (ELECTRODE) ×3 IMPLANT
SLEEVE SCD COMPRESS KNEE MED (MISCELLANEOUS) ×3 IMPLANT
SPONGE GAUZE 4X4 12PLY STER LF (GAUZE/BANDAGES/DRESSINGS) IMPLANT
SPONGE LAP 4X18 X RAY DECT (DISPOSABLE) ×3 IMPLANT
STRIP CLOSURE SKIN 1/2X4 (GAUZE/BANDAGES/DRESSINGS) ×2 IMPLANT
SUT MNCRL AB 4-0 PS2 18 (SUTURE) ×2 IMPLANT
SUT MON AB 5-0 PS2 18 (SUTURE) IMPLANT
SUT SILK 2 0 SH (SUTURE) ×2 IMPLANT
SUT VIC AB 2-0 SH 27 (SUTURE) ×3
SUT VIC AB 2-0 SH 27XBRD (SUTURE) ×1 IMPLANT
SUT VIC AB 3-0 SH 27 (SUTURE) ×3
SUT VIC AB 3-0 SH 27X BRD (SUTURE) ×1 IMPLANT
SUT VIC AB 5-0 PS2 18 (SUTURE) IMPLANT
SYR CONTROL 10ML LL (SYRINGE) ×3 IMPLANT
TOWEL OR 17X24 6PK STRL BLUE (TOWEL DISPOSABLE) ×3 IMPLANT
TOWEL OR NON WOVEN STRL DISP B (DISPOSABLE) ×1 IMPLANT
TUBE CONNECTING 20'X1/4 (TUBING)
TUBE CONNECTING 20X1/4 (TUBING) IMPLANT
YANKAUER SUCT BULB TIP NO VENT (SUCTIONS) IMPLANT

## 2016-07-14 NOTE — Anesthesia Preprocedure Evaluation (Signed)
Anesthesia Evaluation  Patient identified by MRN, date of birth, ID band Patient awake    Reviewed: Allergy & Precautions, NPO status , Patient's Chart, lab work & pertinent test results  Airway Mallampati: II  TM Distance: >3 FB Neck ROM: Full    Dental  (+) Dental Advisory Given   Pulmonary asthma , former smoker,    breath sounds clear to auscultation       Cardiovascular negative cardio ROS   Rhythm:Regular Rate:Normal     Neuro/Psych negative neurological ROS     GI/Hepatic Neg liver ROS, GERD  ,  Endo/Other  negative endocrine ROS  Renal/GU negative Renal ROS     Musculoskeletal   Abdominal   Peds  Hematology negative hematology ROS (+)   Anesthesia Other Findings   Reproductive/Obstetrics                             Anesthesia Physical Anesthesia Plan  ASA: II  Anesthesia Plan: General   Post-op Pain Management:    Induction: Intravenous  Airway Management Planned: LMA  Additional Equipment:   Intra-op Plan:   Post-operative Plan: Extubation in OR  Informed Consent: I have reviewed the patients History and Physical, chart, labs and discussed the procedure including the risks, benefits and alternatives for the proposed anesthesia with the patient or authorized representative who has indicated his/her understanding and acceptance.   Dental advisory given  Plan Discussed with: CRNA  Anesthesia Plan Comments:         Anesthesia Quick Evaluation

## 2016-07-14 NOTE — Interval H&P Note (Signed)
History and Physical Interval Note:  07/14/2016 8:29 AM  Brandi Sutton  has presented today for surgery, with the diagnosis of LEFT BREAST CSL  The various methods of treatment have been discussed with the patient and family. After consideration of risks, benefits and other options for treatment, the patient has consented to  Procedure(s): RADIOACTIVE SEED GUIDED EXCISIONAL BREAST BIOPSY (Left) as a surgical intervention .  The patient's history has been reviewed, patient examined, no change in status, stable for surgery.  I have reviewed the patient's chart and labs.  Questions were answered to the patient's satisfaction.     Ihsan Nomura

## 2016-07-14 NOTE — Anesthesia Procedure Notes (Addendum)
Procedure Name: LMA Insertion Date/Time: 07/14/2016 8:53 AM Performed by: Toula Moos L Pre-anesthesia Checklist: Patient identified, Emergency Drugs available, Suction available, Patient being monitored and Timeout performed Patient Re-evaluated:Patient Re-evaluated prior to inductionOxygen Delivery Method: Circle system utilized Preoxygenation: Pre-oxygenation with 100% oxygen Intubation Type: IV induction Ventilation: Mask ventilation without difficulty LMA: LMA inserted LMA Size: 4.0 Number of attempts: 1 Airway Equipment and Method: Bite block Placement Confirmation: positive ETCO2 Tube secured with: Tape Dental Injury: Teeth and Oropharynx as per pre-operative assessment

## 2016-07-14 NOTE — Transfer of Care (Signed)
Immediate Anesthesia Transfer of Care Note  Patient: Brandi Sutton  Procedure(s) Performed: Procedure(s): RADIOACTIVE SEED GUIDED EXCISIONAL BREAST BIOPSY (Left)  Patient Location: PACU  Anesthesia Type:General  Level of Consciousness: awake and patient cooperative  Airway & Oxygen Therapy: Patient Spontanous Breathing and Patient connected to face mask oxygen  Post-op Assessment: Report given to RN and Post -op Vital signs reviewed and stable  Post vital signs: Reviewed and stable  Last Vitals:  Vitals:   07/14/16 0747  BP: 103/66  Pulse: 73  Resp: 20  Temp: 36.6 C    Last Pain:  Vitals:   07/14/16 0747  TempSrc: Oral         Complications: No apparent anesthesia complications

## 2016-07-14 NOTE — H&P (Signed)
Brandi Sutton is an 40 y.o. female.   Chief Complaint: abnl mm HPI:   59 yof who I know from a lap chole/primary umbo hernia repair recently who then underwent a screening mm that showed bilateral lesions. she had no symptoms prior to this, no mass or discharge. she has density c. there are heterogeneous calcifications on the right spannning 8.5 cm and the left has area of distortion. US shows on the left a 1.4x1.3x1.3 dcm area. [place] axilla negative. biopsy of the right side is sclerosing adenosis with calcs and this is concordant. the left side is a csl. she has family history in a great aunt of breast cancer. no prior breast history   Past Medical History:  Diagnosis Date  . Allergy   . ASTHMA 03/31/2007   no attack in years per patient   . Asthma    allergy related  . Breast mass, left   . CONTACT DERMATITIS&OTHER ECZEMA DUE TO PLANTS 12/03/2009  . Depression   . DYSFUNCTIONAL UTERINE BLEEDING 01/24/2010  . GERD (gastroesophageal reflux disease)   . Headache    sinus headaches   . HIP PAIN, RIGHT 01/22/2009  . LOW BACK PAIN, ACUTE 04/17/2008    Past Surgical History:  Procedure Laterality Date  . CHOLECYSTECTOMY N/A 12/25/2015   Procedure: LAPAROSCOPIC CHOLECYSTECTOMY;  Surgeon: Rolm Bookbinder, MD;  Location: [facility];  Service: General;  Laterality: N/A;  . TONSILLECTOMY AND ADENOIDECTOMY    . uterine abalation    . WISDOM TOOTH EXTRACTION      Family History  Problem Relation Age of Onset  . Depression Mother   . Diabetes Mother     diet controlled  . Transient ischemic attack Mother   . Colon polyps Mother     pre-cancerous  . Hypertension Father   . Diabetes Father     diet controlled  . Bowel Disease Father     blockage, had to wear a bag for awhile  . Lung cancer Paternal Grandfather   . Stroke Maternal Grandfather    Social History:  reports that she quit smoking about 10 years ago. Her smoking use included Cigarettes. She has never used smokeless tobacco.  She reports that she drinks alcohol. She reports that she does not use drugs.  Allergies:  Allergies  Allergen Reactions  . Penicillins Hives and Itching    Has patient had a PCN reaction causing immediate rash, facial/tongue/throat swelling, SOB or lightheadedness with hypotension: no Has patient had a PCN reaction causing severe rash involving mucus membranes or skin necrosis: no Has patient had a PCN reaction that required hospitalization no Has patient had a PCN reaction occurring within the last 10 years: yes If all of the above answers are "NO", then may proceed with Cephalosporin use.   . Wellbutrin [Bupropion] Hives  . Bupropion Hcl Hives and Rash    No prescriptions prior to admission.    No results found for this or any previous visit (from the past 48 hour(s)). No results found.  ROS Negative  Height '5\' 6"'$  (1.676 m), weight 87.1 kg (192 lb). Physical Exam   Vitals Weight: 191 lb Height: 67in Body Surface Area: 1.98 m Body Mass Index: 29.91 kg/m  Temp.: 70F(Temporal)  Pulse: 79 (Regular)  BP: 124/82 (Sitting, Left Arm, Standard) Physical Exam General Mental Status-Alert. Orientation-Oriented X3. Chest and Lung Exam Chest and lung exam reveals -on auscultation, normal breath sounds Breast Nipples-No Discharge. Breast Lump-No Palpable Breast Mass. Cardiovascular Cardiovascular examination reveals -normal heart sounds,  regular rate and rhythm with no murmurs. Lymphatic Head & Neck General Head & Neck Lymphatics: Bilateral - Description - Normal. Axillary General Axillary Region: Bilateral - Description - Normal. 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 we discussed placement of seed, surgery, risks and recovery. will schedule  soon    Marinell Igarashi, MD 07/14/2016, 7:11 AM

## 2016-07-14 NOTE — Op Note (Signed)
Preoperative diagnoses: left breast mammographic lesion Postoperative diagnosis: Same as above Procedure:Leftbreast seed guided excisional biopsy Surgeon: Dr. Serita Grammes Anesthesia: Gen. Estimated blood loss: Minimal Complications: None Drains: None Specimens:Left breast tissue with paint, additional superior tissue short superior, long lateral double deep Sponge and needle count correct at completion Disposition to recovery stable  Indications: [name] who underwent mm with abnormality that underwent core biopsy that was a radial scar.  We discussed excisional biopsy using seed guidance. She had seed placed prior to beginning and the mammograms were in the room.   Procedure: After informed consent was obtained she was then taken to the operating room. She was given cefazolin. Sequential compression devices were on her legs. She was placed under general anesthesia without complication. Her left breast was then prepped and draped in the standard sterile surgical fashion. A surgical timeout was then performed.  I located the radioactive seed with the neoprobe. I infiltrated marcaine in the area of the seed as well as around the areola. I made an incision at areolar border in an effort to hide her scar. I then used the neoprobe to guide the excision of the seed and surrounding tissue. This was confirmed by the neoprobe. This was then taken for mammogram which confirmed removal of the seed and the clip. This was confirmed by radiology. This was then sent to pathology. There was some additional nodular tissue superior that I excised as well. Hemostasis was observed.I closed the breast tissue with a 2-0 Vicryl. The dermis was closed with 3-0 Vicryl and the skin with 5-0 Monocryl.Dermabond and steristrips were placed on the incision. A breast binder was placed. She was transferred to recovery stable

## 2016-07-14 NOTE — Discharge Instructions (Signed)
[facility] °Office Phone Number [phone] ° °POST OP INSTRUCTIONS ° °Always review your discharge instruction sheet given to you by the facility where your surgery was performed. ° °IF YOU HAVE DISABILITY OR FAMILY LEAVE FORMS, YOU MUST BRING THEM TO THE OFFICE FOR PROCESSING.  DO NOT GIVE THEM TO YOUR DOCTOR. ° °1. A prescription for pain medication may be given to you upon discharge.  Take your pain medication as prescribed, if needed.  If narcotic pain medicine is not needed, then you may take acetaminophen (Tylenol), naprosyn (Alleve) or ibuprofen (Advil) as needed. °2. Take your usually prescribed medications unless otherwise directed °3. If you need a refill on your pain medication, please contact your pharmacy.  They will contact our office to request authorization.  Prescriptions will not be filled after 5pm or on week-ends. °4. You should eat very light the first 24 hours after surgery, such as soup, crackers, pudding, etc.  Resume your normal diet the day after surgery. °5. Most patients will experience some swelling and bruising in the breast.  Ice packs and a good support bra will help.  Wear the breast binder provided or a sports bra for 72 hours day and night.  After that wear a sports bra during the day until you return to the office. Swelling and bruising can take several days to resolve.  °6. It is common to experience some constipation if taking pain medication after surgery.  Increasing fluid intake and taking a stool softener will usually help or prevent this problem from occurring.  A mild laxative (Milk of Magnesia or Miralax) should be taken according to package directions if there are no bowel movements after 48 hours. °7. Unless discharge instructions indicate otherwise, you may remove your bandages 48 hours after surgery and you may shower at that time.  You may have steri-strips (small skin tapes) in place directly over the incision.  These strips should be left on the  skin for 7-10 days and will come off on their own.  If your surgeon used skin glue on the incision, you may shower in 24 hours.  The glue will flake off over the next 2-3 weeks.  Any sutures or staples will be removed at the office during your follow-up visit. °8. ACTIVITIES:  You may resume regular daily activities (gradually increasing) beginning the next day.  Wearing a good support bra or sports bra minimizes pain and swelling.  You may have sexual intercourse when it is comfortable. °a. You may drive when you no longer are taking prescription pain medication, you can comfortably wear a seatbelt, and you can safely maneuver your car and apply brakes. °b. RETURN TO WORK:  ______________________________________________________________________________________ °9. You should see your doctor in the office for a follow-up appointment approximately two weeks after your surgery.  Your doctor’s nurse will typically make your follow-up appointment when she calls you with your pathology report.  Expect your pathology report 3-4 business days after your surgery.  You may call to check if you do not hear from us after three days. °10. OTHER INSTRUCTIONS: _______________________________________________________________________________________________ _____________________________________________________________________________________________________________________________________ °_____________________________________________________________________________________________________________________________________ °_____________________________________________________________________________________________________________________________________ ° °WHEN TO CALL DR WAKEFIELD: °1. Fever over 101.0 °2. Nausea and/or vomiting. °3. Extreme swelling or bruising. °4. Continued bleeding from incision. °5. Increased pain, redness, or drainage from the incision. ° °The clinic staff is available to answer your questions during regular  business hours.  Please don’t hesitate to call and ask to speak to one of the nurses for clinical concerns.  If   you have a medical emergency, go to the nearest emergency room or call 911.  A surgeon from [facility] is always on call at the hospital. ° °For further questions, please visit [email or web address] ° ° ° °Post Anesthesia Home Care Instructions ° °Activity: °Get plenty of rest for the remainder of the day. A responsible adult should stay with you for 24 hours following the procedure.  °For the next 24 hours, DO NOT: °-Drive a car °-Operate machinery °-Drink alcoholic beverages °-Take any medication unless instructed by your physician °-Make any legal decisions or sign important papers. ° °Meals: °Start with liquid foods such as gelatin or soup. Progress to regular foods as tolerated. Avoid greasy, spicy, heavy foods. If nausea and/or vomiting occur, drink only clear liquids until the nausea and/or vomiting subsides. Call your physician if vomiting continues. ° °Special Instructions/Symptoms: °Your throat may feel dry or sore from the anesthesia or the breathing tube placed in your throat during surgery. If this causes discomfort, gargle with warm salt water. The discomfort should disappear within 24 hours. ° °If you had a scopolamine patch placed behind your ear for the management of post- operative nausea and/or vomiting: ° °1. The medication in the patch is effective for 72 hours, after which it should be removed.  Wrap patch in a tissue and discard in the trash. Wash hands thoroughly with soap and water. °2. You may remove the patch earlier than 72 hours if you experience unpleasant side effects which may include dry mouth, dizziness or visual disturbances. °3. Avoid touching the patch. Wash your hands with soap and water after contact with the patch. °  ° °

## 2016-07-14 NOTE — Anesthesia Postprocedure Evaluation (Signed)
Anesthesia Post Note  Patient: Brandi Sutton  Procedure(s) Performed: Procedure(s) (LRB): RADIOACTIVE SEED GUIDED EXCISIONAL BREAST BIOPSY (Left)  Patient location during evaluation: PACU Anesthesia Type: General Level of consciousness: awake and alert Pain management: pain level controlled Vital Signs Assessment: post-procedure vital signs reviewed and stable Respiratory status: spontaneous breathing, nonlabored ventilation, respiratory function stable and patient connected to nasal cannula oxygen Cardiovascular status: blood pressure returned to baseline and stable Postop Assessment: no signs of nausea or vomiting Anesthetic complications: no    Last Vitals:  Vitals:   07/14/16 0946 07/14/16 1000  BP: 108/78 113/73  Pulse: 62 73  Resp: 15 18  Temp:      Last Pain:  Vitals:   07/14/16 0956  TempSrc:   PainSc: 5                  Tiajuana Amass

## 2016-07-15 ENCOUNTER — Encounter (HOSPITAL_BASED_OUTPATIENT_CLINIC_OR_DEPARTMENT_OTHER): Payer: Self-pay | Admitting: General Surgery

## 2016-09-30 ENCOUNTER — Ambulatory Visit (INDEPENDENT_AMBULATORY_CARE_PROVIDER_SITE_OTHER): Payer: BLUE CROSS/BLUE SHIELD | Admitting: Family Medicine

## 2016-09-30 ENCOUNTER — Encounter: Payer: Self-pay | Admitting: Family Medicine

## 2016-09-30 VITALS — BP 102/82 | HR 74 | Temp 98.1°F | Ht 66.0 in | Wt 199.0 lb

## 2016-09-30 DIAGNOSIS — J4521 Mild intermittent asthma with (acute) exacerbation: Secondary | ICD-10-CM | POA: Diagnosis not present

## 2016-09-30 MED ORDER — FLUTICASONE PROPIONATE HFA 44 MCG/ACT IN AERO: 2.0000 | INHALATION_SPRAY | Freq: Two times a day (BID) | RESPIRATORY_TRACT | 1 refills | Status: DC

## 2016-09-30 MED ORDER — PREDNISONE 20 MG PO TABS: ORAL_TABLET | ORAL | 1 refills | Status: DC

## 2016-09-30 MED ORDER — ALBUTEROL SULFATE HFA 108 (90 BASE) MCG/ACT IN AERS: 2.0000 | INHALATION_SPRAY | Freq: Four times a day (QID) | RESPIRATORY_TRACT | 1 refills | Status: DC | PRN

## 2016-09-30 MED ORDER — HYDROCODONE-HOMATROPINE 5-1.5 MG/5ML PO SYRP: 5.0000 mL | ORAL_SOLUTION | Freq: Three times a day (TID) | ORAL | 0 refills | Status: DC | PRN

## 2016-09-30 NOTE — Progress Notes (Signed)
Brandi Sutton is a 41 year old married female nonsmoker who comes in today for evaluation of a cough for 4 weeks  She says she caught a cold a month ago that lasted for 5 days to call went away but the cough persisted and will go away. She quit smoking in 2007. She's had a history of asthma related to viral infections.  She has some albuterol at home but says is expired so she didn't use it.  No fever chills or sputum production.  In 2017 she had a sclerosing lesion removed from her left breast which turned out to be benign and she had her gallbladder out  Review of systems otherwise negative  BP 102/82 (BP Location: Right Arm, Patient Position: Sitting, Cuff Size: Normal)   Pulse 74   Temp 98.1 F (36.7 C) (Oral)   Ht '5\' 6"'$  (1.676 m)   Wt 199 lb (90.3 kg)   BMI 32.12 kg/m  General she is a well-developed well-nourished female no acute distress vital signs stable she's afebrile HEENT were negative neck was supple thyroid is not enlarged pulmonary exam shows normal symmetrical breath sounds however with forced expiration there is mild wheezing symmetrically. No crackles  #1 impression viral syndrome with secondary asthma.........Marland Kitchen prednisone burst and taper

## 2016-09-30 NOTE — Patient Instructions (Signed)
Drink lots of water  Prednisone 20 mg.......... one half tab for 7 days........ then one half tab Monday Wednesday Friday for a three-week taper  Albuterol inhaler.............. 2 puffs 3 times a day when necessary  Flovent..... Inhaled steroid..... 2 puffs twice daily  Hydromet..... 1/2-1 teaspoon at bedtime when necessary for nighttime cough may repeat in the middle of night as needed  Return when necessary  For the headache I would recommend Motrin,,,,, 600 mg twice daily with food,,,,, and ice packs when it becomes intense

## 2016-09-30 NOTE — Progress Notes (Signed)
Pre visit review using our clinic review tool, if applicable. No additional management support is needed unless otherwise documented below in the visit note. 

## 2016-12-01 DIAGNOSIS — C44311 Basal cell carcinoma of skin of nose: Secondary | ICD-10-CM | POA: Diagnosis not present

## 2016-12-05 ENCOUNTER — Ambulatory Visit (INDEPENDENT_AMBULATORY_CARE_PROVIDER_SITE_OTHER): Payer: BLUE CROSS/BLUE SHIELD | Admitting: Adult Health

## 2016-12-05 ENCOUNTER — Encounter: Payer: Self-pay | Admitting: Adult Health

## 2016-12-05 VITALS — BP 122/70 | Temp 97.9°F | Ht 66.0 in | Wt 201.0 lb

## 2016-12-05 DIAGNOSIS — J069 Acute upper respiratory infection, unspecified: Secondary | ICD-10-CM | POA: Diagnosis not present

## 2016-12-05 NOTE — Progress Notes (Signed)
Subjective:    Patient ID: Brandi Sutton, female    DOB: 1975/09/05, 41 y.o.   MRN: [id number]  URI   This is a new problem. The current episode started in the past 7 days. There has been no fever. Associated symptoms include congestion and coughing. Pertinent negatives include no rhinorrhea, sinus pain or swollen glands. She has tried inhaler use (Zyrtec ) for the symptoms. The treatment provided no relief.      Review of Systems  Constitutional: Positive for fatigue.  HENT: Positive for congestion. Negative for ear discharge, rhinorrhea and sinus pain.   Eyes: Negative.   Respiratory: Positive for cough, chest tightness and shortness of breath.   Cardiovascular: Negative.   Gastrointestinal: Negative.   Neurological: Negative.    Past Medical History:  Diagnosis Date  . Allergy   . ASTHMA 03/31/2007   no attack in years per patient   . Asthma    allergy related  . Breast mass, left   . CONTACT DERMATITIS&OTHER ECZEMA DUE TO PLANTS 12/03/2009  . Depression   . DYSFUNCTIONAL UTERINE BLEEDING 01/24/2010  . GERD (gastroesophageal reflux disease)   . Headache    sinus headaches   . HIP PAIN, RIGHT 01/22/2009  . LOW BACK PAIN, ACUTE 04/17/2008    Social History   Social History  . Marital status: Married    Spouse name: N/A  . Number of children: 1  . Years of education: N/A   Occupational History  . Chartered certified accountant    Social History Main Topics  . Smoking status: Former Smoker    Types: Cigarettes    Quit date: 12/09/2005  . Smokeless tobacco: Never Used  . Alcohol use 0.0 oz/week     Comment: 2-3 beers per week   . Drug use: No  . Sexual activity: Not on file   Other Topics Concern  . Not on file   Social History Narrative  . No narrative on file    Past Surgical History:  Procedure Laterality Date  . CHOLECYSTECTOMY N/A 12/25/2015   Procedure: LAPAROSCOPIC CHOLECYSTECTOMY;  Surgeon: Rolm Bookbinder, MD;  Location: [facility];  Service: General;   Laterality: N/A;  . RADIOACTIVE SEED GUIDED EXCISIONAL BREAST BIOPSY Left 07/14/2016   Procedure: RADIOACTIVE SEED GUIDED EXCISIONAL BREAST BIOPSY;  Surgeon: Rolm Bookbinder, MD;  Location: [place];  Service: General;  Laterality: Left;  . TONSILLECTOMY AND ADENOIDECTOMY    . uterine abalation    . WISDOM TOOTH EXTRACTION      Family History  Problem Relation Age of Onset  . Depression Mother   . Diabetes Mother     diet controlled  . Transient ischemic attack Mother   . Colon polyps Mother     pre-cancerous  . Hypertension Father   . Diabetes Father     diet controlled  . Bowel Disease Father     blockage, had to wear a bag for awhile  . Lung cancer Paternal Grandfather   . Stroke Maternal Grandfather     Allergies  Allergen Reactions  . Penicillins Hives and Itching    Has patient had a PCN reaction causing immediate rash, facial/tongue/throat swelling, SOB or lightheadedness with hypotension: no Has patient had a PCN reaction causing severe rash involving mucus membranes or skin necrosis: no Has patient had a PCN reaction that required hospitalization no Has patient had a PCN reaction occurring within the last 10 years: yes If all of the above answers are "NO", then may proceed  with Cephalosporin use.   . Wellbutrin [Bupropion] Hives  . Bupropion Hcl Hives and Rash    Current Outpatient Prescriptions on File Prior to Visit  Medication Sig Dispense Refill  . acetaminophen (TYLENOL) 500 MG tablet Take 1,000 mg by mouth every 6 (six) hours as needed (For pain.).    Marland Kitchen albuterol (PROVENTIL HFA;VENTOLIN HFA) 108 (90 Base) MCG/ACT inhaler Inhale 2 puffs into the lungs every 6 (six) hours as needed for wheezing or shortness of breath. 1 Inhaler 1  . cetirizine (ZYRTEC) 10 MG tablet Take 10 mg by mouth daily.    . fluticasone (FLONASE) 50 MCG/ACT nasal spray Place 1 spray into both nostrils daily. (Patient taking differently: Place 1 spray into both nostrils  daily as needed. ) 16 g 6  . hyoscyamine (LEVSIN SL) 0.125 MG SL tablet Place 1 tablet (0.125 mg total) under the tongue every 6 (six) hours as needed. 20 tablet 1  . lansoprazole (PREVACID) 15 MG capsule Take 15 mg by mouth daily.      No current facility-administered medications on file prior to visit.     BP 122/70 (BP Location: Left Arm, Patient Position: Sitting, Cuff Size: Normal)   Temp 97.9 F (36.6 C) (Oral)   Ht '5\' 6"'$  (1.676 m)   Wt 201 lb (91.2 kg)   BMI 32.44 kg/m       Objective:   Physical Exam  Constitutional: She is oriented to person, place, and time. She appears well-developed and well-nourished. No distress.  HENT:  Head: Normocephalic and atraumatic.  Right Ear: External ear normal.  Left Ear: External ear normal.  Nose: Nose normal.  Mouth/Throat: Oropharynx is clear and moist. No oropharyngeal exudate.  Eyes: Conjunctivae and EOM are normal. Pupils are equal, round, and reactive to light. Right eye exhibits no discharge. Left eye exhibits no discharge. No scleral icterus.  Neck: Neck supple.  Cardiovascular: Normal rate, regular rhythm, normal heart sounds and intact distal pulses.  Exam reveals no gallop and no friction rub.   No murmur heard. Pulmonary/Chest: Effort normal and breath sounds normal. No respiratory distress. She has no wheezes. She has no rales. She exhibits no tenderness.  Lymphadenopathy:    She has no cervical adenopathy.  Neurological: She is alert and oriented to person, place, and time.  Skin: Skin is warm and dry. No rash noted. She is not diaphoretic. No erythema. No pallor.  Psychiatric: She has a normal mood and affect. Her behavior is normal. Judgment and thought content normal.  Nursing note and vitals reviewed.     Assessment & Plan:  1. Upper respiratory tract infection, unspecified type - Continue conservative therapy. There is no signs of acute asthma exacerbation, bronchitis or pneumonia.  - Add mucinex.  - Add  Flonase - Follow up as needed  Dorothyann Peng, NP

## 2016-12-24 DIAGNOSIS — C44311 Basal cell carcinoma of skin of nose: Secondary | ICD-10-CM | POA: Diagnosis not present

## 2017-03-03 ENCOUNTER — Encounter: Payer: Self-pay | Admitting: Family Medicine

## 2017-03-03 ENCOUNTER — Ambulatory Visit (INDEPENDENT_AMBULATORY_CARE_PROVIDER_SITE_OTHER): Payer: BLUE CROSS/BLUE SHIELD | Admitting: Family Medicine

## 2017-03-03 VITALS — BP 120/80 | HR 88 | Temp 97.6°F | Wt 208.6 lb

## 2017-03-03 DIAGNOSIS — R635 Abnormal weight gain: Secondary | ICD-10-CM | POA: Diagnosis not present

## 2017-03-03 DIAGNOSIS — S61211A Laceration without foreign body of left index finger without damage to nail, initial encounter: Secondary | ICD-10-CM

## 2017-03-03 DIAGNOSIS — Z23 Encounter for immunization: Secondary | ICD-10-CM

## 2017-03-03 DIAGNOSIS — R600 Localized edema: Secondary | ICD-10-CM

## 2017-03-03 DIAGNOSIS — R5383 Other fatigue: Secondary | ICD-10-CM | POA: Diagnosis not present

## 2017-03-03 LAB — POCT URINALYSIS DIPSTICK
Bilirubin, UA: NEGATIVE
Blood, UA: NEGATIVE
Glucose, UA: NEGATIVE
KETONES UA: NEGATIVE
LEUKOCYTES UA: NEGATIVE
Nitrite, UA: NEGATIVE
PH UA: 6 (ref 5.0–8.0)
PROTEIN UA: NEGATIVE
Spec Grav, UA: 1.02 (ref 1.010–1.025)
UROBILINOGEN UA: 0.2 U/dL

## 2017-03-03 NOTE — Patient Instructions (Signed)
Edema Edema is an abnormal buildup of fluids in your bodytissues. Edema is somewhatdependent on gravity to pull the fluid to the lowest place in your body. That makes the condition more common in the legs and thighs (lower extremities). Painless swelling of the feet and ankles is common and becomes more likely as you get older. It is also common in looser tissues, like around your eyes. When the affected area is squeezed, the fluid may move out of that spot and leave a dent for a few moments. This dent is called pitting. What are the causes? There are many possible causes of edema. Eating too much salt and being on your feet or sitting for a long time can cause edema in your legs and ankles. Hot weather may make edema worse. Common medical causes of edema include:  Heart failure.  Liver disease.  Kidney disease.  Weak blood vessels in your legs.  Cancer.  An injury.  Pregnancy.  Some medications.  Obesity.  What are the signs or symptoms? Edema is usually painless.Your skin may look swollen or shiny. How is this diagnosed? Your health care provider may be able to diagnose edema by asking about your medical history and doing a physical exam. You may need to have tests such as X-rays, an electrocardiogram, or blood tests to check for medical conditions that may cause edema. How is this treated? Edema treatment depends on the cause. If you have heart, liver, or kidney disease, you need the treatment appropriate for these conditions. General treatment may include:  Elevation of the affected body part above the level of your heart.  Compression of the affected body part. Pressure from elastic bandages or support stockings squeezes the tissues and forces fluid back into the blood vessels. This keeps fluid from entering the tissues.  Restriction of fluid and salt intake.  Use of a water pill (diuretic). These medications are appropriate only for some types of edema. They pull fluid  out of your body and make you urinate more often. This gets rid of fluid and reduces swelling, but diuretics can have side effects. Only use diuretics as directed by your health care provider.  Follow these instructions at home:  Keep the affected body part above the level of your heart when you are lying down.  Do not sit still or stand for prolonged periods.  Do not put anything directly under your knees when lying down.  Do not wear constricting clothing or garters on your upper legs.  Exercise your legs to work the fluid back into your blood vessels. This may help the swelling go down.  Wear elastic bandages or support stockings to reduce ankle swelling as directed by your health care provider.  Eat a low-salt diet to reduce fluid if your health care provider recommends it.  Only take medicines as directed by your health care provider. Contact a health care provider if:  Your edema is not responding to treatment.  You have heart, liver, or kidney disease and notice symptoms of edema.  You have edema in your legs that does not improve after elevating them.  You have sudden and unexplained weight gain. Get help right away if:  You develop shortness of breath or chest pain.  You cannot breathe when you lie down.  You develop pain, redness, or warmth in the swollen areas.  You have heart, liver, or kidney disease and suddenly get edema.  You have a fever and your symptoms suddenly get worse. This information is   not intended to replace advice given to you by your health care provider. Make sure you discuss any questions you have with your health care provider. Document Released: 07/28/2005 Document Revised: 01/03/2016 Document Reviewed: 05/20/2013 Elsevier Interactive Patient Education  2017 Elsevier Inc.  

## 2017-03-03 NOTE — Progress Notes (Signed)
Subjective:     Patient ID: Brandi Sutton, female   DOB: 12-11-1975, 41 y.o.   MRN: [id number]  HPI Patient seen as a work in today with nonspecific symptoms of progressive fatigue, weight gain, lower extremity edema over several months. She noticed some increased swelling in her legs around April. She states the right leg is slightly more swollen than the left. She's had progressive fatigue for many months. Her weight was 190 pounds last year up to current weight of 208 pounds. No chest pains. Denies any dyspnea with exertion. No orthopnea. Edema tends to be worse late in the day. Quit smoking 11 years ago. Only regular medications are Prevacid and Zyrtec.  Generally tries to watch her salt intake closely. Denies any arthralgias. No recent nonsteroidal use. She is married. No observed sleep apnea. Generally feels fairly rested in the mornings.  Separate issue is laceration left index finger. She states that a glass broke last night around 8 PM. She has flap-like laceration between the PIP and DIP joint. Full range of motion all joints. Bleeding controlled with pressure. Last tetanus 2007.  Past Medical History:  Diagnosis Date  . Allergy   . ASTHMA 03/31/2007   no attack in years per patient   . Asthma    allergy related  . Breast mass, left   . CONTACT DERMATITIS&OTHER ECZEMA DUE TO PLANTS 12/03/2009  . Depression   . DYSFUNCTIONAL UTERINE BLEEDING 01/24/2010  . GERD (gastroesophageal reflux disease)   . Headache    sinus headaches   . HIP PAIN, RIGHT 01/22/2009  . LOW BACK PAIN, ACUTE 04/17/2008   Past Surgical History:  Procedure Laterality Date  . CHOLECYSTECTOMY N/A 12/25/2015   Procedure: LAPAROSCOPIC CHOLECYSTECTOMY;  Surgeon: Rolm Bookbinder, MD;  Location: [facility];  Service: General;  Laterality: N/A;  . RADIOACTIVE SEED GUIDED EXCISIONAL BREAST BIOPSY Left 07/14/2016   Procedure: RADIOACTIVE SEED GUIDED EXCISIONAL BREAST BIOPSY;  Surgeon: Rolm Bookbinder, MD;  Location: [place];  Service: General;  Laterality: Left;  . TONSILLECTOMY AND ADENOIDECTOMY    . uterine abalation    . WISDOM TOOTH EXTRACTION      reports that she quit smoking about 11 years ago. Her smoking use included Cigarettes. She has never used smokeless tobacco. She reports that she drinks alcohol. She reports that she does not use drugs. family history includes Bowel Disease in her father; Colon polyps in her mother; Depression in her mother; Diabetes in her father and mother; Hypertension in her father; Lung cancer in her paternal grandfather; Stroke in her maternal grandfather; Transient ischemic attack in her mother. Allergies  Allergen Reactions  . Penicillins Hives and Itching    Has patient had a PCN reaction causing immediate rash, facial/tongue/throat swelling, SOB or lightheadedness with hypotension: no Has patient had a PCN reaction causing severe rash involving mucus membranes or skin necrosis: no Has patient had a PCN reaction that required hospitalization no Has patient had a PCN reaction occurring within the last 10 years: yes If all of the above answers are "NO", then may proceed with Cephalosporin use.   . Wellbutrin [Bupropion] Hives  . Bupropion Hcl Hives and Rash     Review of Systems  Constitutional: Positive for fatigue and unexpected weight change. Negative for chills and fever.  Eyes: Negative for visual disturbance.  Respiratory: Negative for cough and shortness of breath.   Cardiovascular: Positive for leg swelling. Negative for chest pain and palpitations.  Gastrointestinal: Negative for abdominal pain.  Endocrine: Negative for polydipsia and polyuria.  Genitourinary: Negative for dysuria.  Musculoskeletal: Negative for back pain.  Neurological: Negative for dizziness, weakness and headaches.  Hematological: Negative for adenopathy. Does not bruise/bleed easily.       Objective:   Physical Exam  Constitutional: She appears well-developed  and well-nourished.  Neck: Neck supple. No JVD present. No thyromegaly present.  Cardiovascular: Normal rate and regular rhythm.   Pulmonary/Chest: Effort normal and breath sounds normal. No respiratory distress. She has no wheezes. She has no rales.  Musculoskeletal:  She has trace nonpitting edema legs ankles and feet bilaterally. Good distal foot pulses  Left index -good range of motion DIP and PIP joints  Lymphadenopathy:    She has no cervical adenopathy.  Skin:  Patient has superficial flap laceration of her left index finger dorsally midway between the PIP and DIP joint No active bleeding. No erythema. No drainage.       Assessment:     #1 patient presents with bilateral leg edema over the past few months along with some weight gain over the past year and nonspecific fatigue. Suspect some of her fatigue may be related to her weight gain. May have component of venous stasis. No observed apnea and blood pressure stable  #2 superficial flap laceration left index finger which occurred last night    Plan:     -Tetanus booster given -Steri-Strips applied to left index finger and follow-up promptly for any  erythema, swelling, or other signs of secondary infection -Obtain screening labs with TSH, CBC, chemistries, urine dipstick -If above labs normal, strongly recommend weight loss.  . Consider Weight Watchers versus other program  Eulas Post MD [facility]

## 2017-03-04 LAB — CBC WITH DIFFERENTIAL/PLATELET
BASOS ABS: 0 10*3/uL (ref 0.0–0.1)
Basophils Relative: 0.1 % (ref 0.0–3.0)
EOS ABS: 0.2 10*3/uL (ref 0.0–0.7)
Eosinophils Relative: 2.9 % (ref 0.0–5.0)
HEMATOCRIT: 43.9 % (ref 36.0–46.0)
Hemoglobin: 14.6 g/dL (ref 12.0–15.0)
LYMPHS PCT: 22 % (ref 12.0–46.0)
Lymphs Abs: 1.9 10*3/uL (ref 0.7–4.0)
MCHC: 33.3 g/dL (ref 30.0–36.0)
MCV: 91.1 fl (ref 78.0–100.0)
MONO ABS: 0.6 10*3/uL (ref 0.1–1.0)
Monocytes Relative: 6.9 % (ref 3.0–12.0)
NEUTROS ABS: 5.8 10*3/uL (ref 1.4–7.7)
NEUTROS PCT: 68.1 % (ref 43.0–77.0)
PLATELETS: 309 10*3/uL (ref 150.0–400.0)
RBC: 4.82 Mil/uL (ref 3.87–5.11)
RDW: 12.9 % (ref 11.5–15.5)
WBC: 8.5 10*3/uL (ref 4.0–10.5)

## 2017-03-04 LAB — HEPATIC FUNCTION PANEL
ALT: 40 U/L — AB (ref 0–35)
AST: 29 U/L (ref 0–37)
Albumin: 4.2 g/dL (ref 3.5–5.2)
Alkaline Phosphatase: 74 U/L (ref 39–117)
BILIRUBIN DIRECT: 0.1 mg/dL (ref 0.0–0.3)
BILIRUBIN TOTAL: 0.5 mg/dL (ref 0.2–1.2)
Total Protein: 6.8 g/dL (ref 6.0–8.3)

## 2017-03-04 LAB — BASIC METABOLIC PANEL
BUN: 8 mg/dL (ref 6–23)
CALCIUM: 9.5 mg/dL (ref 8.4–10.5)
CO2: 29 meq/L (ref 19–32)
CREATININE: 0.83 mg/dL (ref 0.40–1.20)
Chloride: 103 mEq/L (ref 96–112)
GFR: 79.54 mL/min (ref >60.00)
Glucose, Bld: 81 mg/dL (ref 70–99)
Potassium: 4.3 mEq/L (ref 3.5–5.1)
Sodium: 137 mEq/L (ref 135–145)

## 2017-03-04 LAB — TSH: TSH: 2.28 u[IU]/mL (ref 0.35–4.50)

## 2017-03-16 ENCOUNTER — Telehealth: Payer: Self-pay | Admitting: Family Medicine

## 2017-03-16 NOTE — Telephone Encounter (Signed)
The swelling has not gotten any better from the last visit.   Pharm:  [facility]

## 2017-03-16 NOTE — Telephone Encounter (Signed)
Left message on machine for patient to return our call 

## 2017-03-16 NOTE — Telephone Encounter (Signed)
Let's start HCTZ 12.5 mg 1 daily and office follow-up in 2 weeks to reassess. Tried to consume potassium-rich foods daily such as banana

## 2017-03-17 MED ORDER — HYDROCHLOROTHIAZIDE 12.5 MG PO CAPS: 12.5000 mg | ORAL_CAPSULE | Freq: Every day | ORAL | 0 refills | Status: DC

## 2017-03-17 NOTE — Addendum Note (Signed)
Addended by: Westley Hummer B on: 03/17/2017 09:58 AM   Modules accepted: Orders

## 2017-03-17 NOTE — Telephone Encounter (Signed)
Patient is aware.  rx sent.  Appointment made.

## 2017-03-31 ENCOUNTER — Ambulatory Visit (INDEPENDENT_AMBULATORY_CARE_PROVIDER_SITE_OTHER): Payer: BLUE CROSS/BLUE SHIELD | Admitting: Family Medicine

## 2017-03-31 ENCOUNTER — Encounter: Payer: Self-pay | Admitting: Family Medicine

## 2017-03-31 VITALS — BP 110/80 | HR 92 | Temp 97.6°F | Wt 207.5 lb

## 2017-03-31 DIAGNOSIS — R600 Localized edema: Secondary | ICD-10-CM | POA: Diagnosis not present

## 2017-03-31 DIAGNOSIS — L539 Erythematous condition, unspecified: Secondary | ICD-10-CM | POA: Diagnosis not present

## 2017-03-31 DIAGNOSIS — R5383 Other fatigue: Secondary | ICD-10-CM

## 2017-03-31 DIAGNOSIS — R635 Abnormal weight gain: Secondary | ICD-10-CM | POA: Diagnosis not present

## 2017-03-31 MED ORDER — DOXYCYCLINE HYCLATE 100 MG PO CAPS: 100.0000 mg | ORAL_CAPSULE | Freq: Two times a day (BID) | ORAL | 0 refills | Status: DC

## 2017-03-31 NOTE — Progress Notes (Signed)
Subjective:     Patient ID: Brandi Sutton, female   DOB: 01/29/1976, 41 y.o.   MRN: [id number]  HPI Patient seen for the following issues  Recent relatively mild bilateral leg edema. She also expresses concerns because some progressive fatigue and weight gain in recent months. We had obtained several labs including thyroid functions and chemistries CBC and these were all basically normal. She started low-dose HCTZ 12.5 mg and her edema is slightly improved. Weight is essentially unchanged. No chest pains. No orthopnea. No exertional dyspnea. No observed sleep apnea  Patient is very concerned about her weight gain. She states she has tried following a low calorie diets but has not seen much improvement. She does though appear to be eating a fair number of high glycemic items such as sugar coffee, ginger ale in the mornings, and things like potatoes  She had recent nasal surgery per dermatology and apparently had small graft. She's no slight redness over that area past several days. Slight itching and also slightly sore to touch. She does not have follow-up with dermatologist in September. No fevers or chills.  Past Medical History:  Diagnosis Date  . Allergy   . ASTHMA 03/31/2007   no attack in years per patient   . Asthma    allergy related  . Breast mass, left   . CONTACT DERMATITIS&OTHER ECZEMA DUE TO PLANTS 12/03/2009  . Depression   . DYSFUNCTIONAL UTERINE BLEEDING 01/24/2010  . GERD (gastroesophageal reflux disease)   . Headache    sinus headaches   . HIP PAIN, RIGHT 01/22/2009  . LOW BACK PAIN, ACUTE 04/17/2008   Past Surgical History:  Procedure Laterality Date  . CHOLECYSTECTOMY N/A 12/25/2015   Procedure: LAPAROSCOPIC CHOLECYSTECTOMY;  Surgeon: Rolm Bookbinder, MD;  Location: [facility];  Service: General;  Laterality: N/A;  . RADIOACTIVE SEED GUIDED EXCISIONAL BREAST BIOPSY Left 07/14/2016   Procedure: RADIOACTIVE SEED GUIDED EXCISIONAL BREAST BIOPSY;  Surgeon: Rolm Bookbinder, MD;   Location: [place];  Service: General;  Laterality: Left;  . TONSILLECTOMY AND ADENOIDECTOMY    . uterine abalation    . WISDOM TOOTH EXTRACTION      reports that she quit smoking about 11 years ago. Her smoking use included Cigarettes. She has never used smokeless tobacco. She reports that she drinks alcohol. She reports that she does not use drugs. family history includes Bowel Disease in her father; Colon polyps in her mother; Depression in her mother; Diabetes in her father and mother; Hypertension in her father; Lung cancer in her paternal grandfather; Stroke in her maternal grandfather; Transient ischemic attack in her mother. Allergies  Allergen Reactions  . Penicillins Hives and Itching    Has patient had a PCN reaction causing immediate rash, facial/tongue/throat swelling, SOB or lightheadedness with hypotension: no Has patient had a PCN reaction causing severe rash involving mucus membranes or skin necrosis: no Has patient had a PCN reaction that required hospitalization no Has patient had a PCN reaction occurring within the last 10 years: yes If all of the above answers are "NO", then may proceed with Cephalosporin use.   . Wellbutrin [Bupropion] Hives  . Bupropion Hcl Hives and Rash     Review of Systems  Constitutional: Positive for fatigue. Negative for appetite change.  Respiratory: Negative for cough.   Cardiovascular: Negative for chest pain and palpitations.  Gastrointestinal: Negative for abdominal pain.  Genitourinary: Negative for dysuria.  Neurological: Negative for dizziness, weakness and headaches.  Hematological: Negative for adenopathy.  Psychiatric/Behavioral: Negative for dysphoric mood.       Objective:   Physical Exam  Constitutional: She is oriented to person, place, and time. She appears well-developed and well-nourished.  HENT:  Mouth/Throat: Oropharynx is clear and moist.  Neck: Neck supple. No thyromegaly present.   Cardiovascular: Normal rate and regular rhythm.   Pulmonary/Chest: Effort normal and breath sounds normal. No respiratory distress. She has no wheezes. She has no rales.  Musculoskeletal:  Only very trace nonpitting edema lower legs bilaterally  Lymphadenopathy:    She has no cervical adenopathy.  Neurological: She is alert and oriented to person, place, and time. No cranial nerve deficit.  Skin:  Patient has mild erythema distal tip left nose. On magnification 1 very small pustule only about 1 mm       Assessment:     #1 bilateral leg edema mild with recent labwork unrevealing. Suspect partly related to recent weight gain. Improved with low-dose HCTZ.   #2 weight gain over several months with recent normal TSH  #3 mild erythema involving left side of nose with 1 very small pustule noted    Plan:     -Check basic metabolic panel with recent initiation of HCTZ -Spent quite some time discussing weight loss strategies. We talked previously about possible weight watchers. Strongly advise calorie tracking application and diligence with setting goals for calorie intake per day and reducing high glycemic foods -Doxycycline 100 mg twice daily for 7 days and follow-up with dermatology if nose erythema not resolving in one week -Consider sleep study to rule out obstructive apnea if fatigue persists-though she's not had any daytime somnolence and feels generally fairly rested early in the mornings  Eulas Post MD [facility]

## 2017-04-01 LAB — BASIC METABOLIC PANEL
BUN: 8 mg/dL (ref 6–23)
CHLORIDE: 100 meq/L (ref 96–112)
CO2: 31 mEq/L (ref 19–32)
Calcium: 9.5 mg/dL (ref 8.4–10.5)
Creatinine, Ser: 0.89 mg/dL (ref 0.40–1.20)
GFR: 73.36 mL/min (ref >60.00)
GLUCOSE: 91 mg/dL (ref 70–99)
POTASSIUM: 4.1 meq/L (ref 3.5–5.1)
Sodium: 140 mEq/L (ref 135–145)

## 2017-04-03 DIAGNOSIS — N951 Menopausal and female climacteric states: Secondary | ICD-10-CM | POA: Diagnosis not present

## 2017-04-03 DIAGNOSIS — R635 Abnormal weight gain: Secondary | ICD-10-CM | POA: Diagnosis not present

## 2017-04-06 DIAGNOSIS — E669 Obesity, unspecified: Secondary | ICD-10-CM | POA: Diagnosis not present

## 2017-04-06 DIAGNOSIS — E559 Vitamin D deficiency, unspecified: Secondary | ICD-10-CM | POA: Diagnosis not present

## 2017-04-06 DIAGNOSIS — E7800 Pure hypercholesterolemia, unspecified: Secondary | ICD-10-CM | POA: Diagnosis not present

## 2017-04-06 DIAGNOSIS — E039 Hypothyroidism, unspecified: Secondary | ICD-10-CM | POA: Diagnosis not present

## 2017-04-17 DIAGNOSIS — E7800 Pure hypercholesterolemia, unspecified: Secondary | ICD-10-CM | POA: Diagnosis not present

## 2017-04-17 DIAGNOSIS — E669 Obesity, unspecified: Secondary | ICD-10-CM | POA: Diagnosis not present

## 2017-04-17 DIAGNOSIS — E559 Vitamin D deficiency, unspecified: Secondary | ICD-10-CM | POA: Diagnosis not present

## 2017-04-24 DIAGNOSIS — E039 Hypothyroidism, unspecified: Secondary | ICD-10-CM | POA: Diagnosis not present

## 2017-04-24 DIAGNOSIS — E669 Obesity, unspecified: Secondary | ICD-10-CM | POA: Diagnosis not present

## 2017-04-24 DIAGNOSIS — E559 Vitamin D deficiency, unspecified: Secondary | ICD-10-CM | POA: Diagnosis not present

## 2017-04-30 DIAGNOSIS — E7800 Pure hypercholesterolemia, unspecified: Secondary | ICD-10-CM | POA: Diagnosis not present

## 2017-04-30 DIAGNOSIS — E559 Vitamin D deficiency, unspecified: Secondary | ICD-10-CM | POA: Diagnosis not present

## 2017-04-30 DIAGNOSIS — E669 Obesity, unspecified: Secondary | ICD-10-CM | POA: Diagnosis not present

## 2017-05-07 DIAGNOSIS — E039 Hypothyroidism, unspecified: Secondary | ICD-10-CM | POA: Diagnosis not present

## 2017-05-07 DIAGNOSIS — E669 Obesity, unspecified: Secondary | ICD-10-CM | POA: Diagnosis not present

## 2017-05-07 DIAGNOSIS — E559 Vitamin D deficiency, unspecified: Secondary | ICD-10-CM | POA: Diagnosis not present

## 2017-05-07 DIAGNOSIS — E7800 Pure hypercholesterolemia, unspecified: Secondary | ICD-10-CM | POA: Diagnosis not present

## 2017-05-15 ENCOUNTER — Encounter: Payer: Self-pay | Admitting: Family Medicine

## 2017-05-15 ENCOUNTER — Ambulatory Visit (INDEPENDENT_AMBULATORY_CARE_PROVIDER_SITE_OTHER): Payer: BLUE CROSS/BLUE SHIELD | Admitting: Family Medicine

## 2017-05-15 VITALS — BP 124/80 | HR 86 | Temp 98.2°F | Resp 12 | Ht 66.0 in | Wt 196.2 lb

## 2017-05-15 DIAGNOSIS — J069 Acute upper respiratory infection, unspecified: Secondary | ICD-10-CM

## 2017-05-15 DIAGNOSIS — J4520 Mild intermittent asthma, uncomplicated: Secondary | ICD-10-CM

## 2017-05-15 DIAGNOSIS — E039 Hypothyroidism, unspecified: Secondary | ICD-10-CM | POA: Diagnosis not present

## 2017-05-15 DIAGNOSIS — J309 Allergic rhinitis, unspecified: Secondary | ICD-10-CM

## 2017-05-15 DIAGNOSIS — E669 Obesity, unspecified: Secondary | ICD-10-CM | POA: Diagnosis not present

## 2017-05-15 DIAGNOSIS — Z20818 Contact with and (suspected) exposure to other bacterial communicable diseases: Secondary | ICD-10-CM

## 2017-05-15 DIAGNOSIS — E559 Vitamin D deficiency, unspecified: Secondary | ICD-10-CM | POA: Diagnosis not present

## 2017-05-15 MED ORDER — PREDNISONE 20 MG PO TABS: 40.0000 mg | ORAL_TABLET | Freq: Every day | ORAL | 0 refills | Status: AC

## 2017-05-15 NOTE — Patient Instructions (Signed)
A few things to remember from today's visit:   Exposure to strep throat - Plan: POC Rapid Strep A  URI, acute  Mild intermittent asthma, unspecified whether complicated - Plan: predniSONE (DELTASONE) 20 MG tablet  Allergic rhinitis, unspecified seasonality, unspecified trigger - Plan: predniSONE (DELTASONE) 20 MG tablet   [name] I have seen you today for an acute visit.  A few things to remember from today's visit:   Exposure to strep throat - Plan: POC Rapid Strep A  URI, acute  Mild intermittent asthma, unspecified whether complicated - Plan: predniSONE (DELTASONE) 20 MG tablet  Allergic rhinitis, unspecified seasonality, unspecified trigger - Plan: predniSONE (DELTASONE) 20 MG tablet   Medications prescribed today are intended for short period of time and will not be refill upon request, a follow up appointment might be necessary to discuss continuation of of treatment if appropriate.  We will follow culture. Albuterol inh 2 puff every 6 hours for a week then as needed for wheezing or shortness of breath.    viral infections are self-limited and we treat each symptom depending of severity.  Over the counter medications as decongestants and cold medications usually help, they need to be taken with caution if there is a history of high blood pressure or palpitations. Tylenol and/or Ibuprofen also helps with most symptoms (headache, muscle aching, fever,etc) Plenty of fluids. Honey helps with cough. Steam inhalations helps with runny nose, nasal congestion, and may prevent sinus infections. Cough and nasal congestion could last a few days and sometimes weeks. Please follow in not any better in 1-2 weeks or if symptoms get worse.  In general please monitor for signs of worsening symptoms and seek immediate medical attention if any concerning.  If symptoms are not resolved in 2-3 weeks you should schedule a follow up appointment with your doctor, before if  needed.  I hope you get better soon!

## 2017-05-15 NOTE — Progress Notes (Signed)
ACUTE VISIT  HPI:  Chief Complaint  Patient presents with  . Sore Throat  . Headache    [name] is a 41 y.o.female here today complaining of 2 days of respiratory symptoms.  Symptoms started the night of 05/13/2017, she states that she usually waits for a few days before she comes to see her PCP at this time she was afraid of having strep throat. "Head congestion", productive cough with occasional phlegm , she denies hemoptysis.  URI   This is a new problem. The current episode started in the past 7 days. The problem has been unchanged. There has been no fever. Associated symptoms include congestion, coughing, ear pain, headaches, a plugged ear sensation, rhinorrhea, a sore throat and wheezing. Pertinent negatives include no abdominal pain, chest pain, diarrhea, nausea, neck pain, rash, swollen glands or vomiting. She has tried decongestant for the symptoms. The treatment provided mild relief.    No Hx of recent travel. + Sick contact: Her daughter was Dx with strep throat yesterday. No known insect bite.  Hx of allergies: Allergic rhinitis and asthma. She uses Albuterol inh as needed. She has had "little" wheezing and chest tightness.symptoms improved after using Albuterol inhaler. Flonase nasal spray prn.  OTC medications for this problem: Alka Seltzer cold and sinus.  She recently completed treatment with Doxycycline, which was prescribed by her dermatologist after nose procedure, BCC.   Review of Systems  Constitutional: Positive for appetite change and fatigue. Negative for activity change and fever.  HENT: Positive for congestion, ear pain, postnasal drip, rhinorrhea, sinus pressure and sore throat. Negative for ear discharge, facial swelling, mouth sores, nosebleeds, trouble swallowing and voice change.   Eyes: Negative for discharge, redness and itching.  Respiratory: Positive for cough, chest tightness and wheezing. Negative for shortness of breath and  stridor.   Cardiovascular: Negative for chest pain and leg swelling.  Gastrointestinal: Negative for abdominal pain, diarrhea, nausea and vomiting.  Musculoskeletal: Negative for gait problem, myalgias and neck pain.  Skin: Negative for pallor and rash.  Allergic/Immunologic: Positive for environmental allergies.  Neurological: Positive for headaches. Negative for syncope and weakness.  Hematological: Negative for adenopathy.  Psychiatric/Behavioral: Negative for confusion. The patient is nervous/anxious.     Current Outpatient Prescriptions on File Prior to Visit  Medication Sig Dispense Refill  . acetaminophen (TYLENOL) 500 MG tablet Take 1,000 mg by mouth every 6 (six) hours as needed (For pain.).    Marland Kitchen albuterol (PROVENTIL HFA;VENTOLIN HFA) 108 (90 Base) MCG/ACT inhaler Inhale 2 puffs into the lungs every 6 (six) hours as needed for wheezing or shortness of breath. 1 Inhaler 1  . cetirizine (ZYRTEC) 10 MG tablet Take 10 mg by mouth daily.    . fluticasone (FLONASE) 50 MCG/ACT nasal spray Place 1 spray into both nostrils daily. (Patient taking differently: Place 1 spray into both nostrils daily as needed. ) 16 g 6  . hydrochlorothiazide (MICROZIDE) 12.5 MG capsule Take 1 capsule (12.5 mg total) by mouth daily. 90 capsule 0  . hyoscyamine (LEVSIN SL) 0.125 MG SL tablet Place 1 tablet (0.125 mg total) under the tongue every 6 (six) hours as needed. 20 tablet 1  . lansoprazole (PREVACID) 15 MG capsule Take 15 mg by mouth daily.      No current facility-administered medications on file prior to visit.      Past Medical History:  Diagnosis Date  . Allergy   . ASTHMA 03/31/2007   no attack in years  per patient   . Asthma    allergy related  . Breast mass, left   . CONTACT DERMATITIS&OTHER ECZEMA DUE TO PLANTS 12/03/2009  . Depression   . DYSFUNCTIONAL UTERINE BLEEDING 01/24/2010  . GERD (gastroesophageal reflux disease)   . Headache    sinus headaches   . HIP PAIN, RIGHT 01/22/2009  .  LOW BACK PAIN, ACUTE 04/17/2008   Allergies  Allergen Reactions  . Penicillins Hives and Itching    Has patient had a PCN reaction causing immediate rash, facial/tongue/throat swelling, SOB or lightheadedness with hypotension: no Has patient had a PCN reaction causing severe rash involving mucus membranes or skin necrosis: no Has patient had a PCN reaction that required hospitalization no Has patient had a PCN reaction occurring within the last 10 years: yes If all of the above answers are "NO", then may proceed with Cephalosporin use.   . Wellbutrin [Bupropion] Hives  . Bupropion Hcl Hives and Rash    Social History   Social History  . Marital status: Married    Spouse name: N/A  . Number of children: 1  . Years of education: N/A   Occupational History  . Chartered certified accountant    Social History Main Topics  . Smoking status: Former Smoker    Types: Cigarettes    Quit date: 12/09/2005  . Smokeless tobacco: Never Used  . Alcohol use 0.0 oz/week     Comment: 2-3 beers per week   . Drug use: No  . Sexual activity: Not Asked   Other Topics Concern  . None   Social History Narrative  . None    Vitals:   05/15/17 0946  BP: 124/80  Pulse: 86  Resp: 12  Temp: 98.2 F (36.8 C)  SpO2: 96%   Body mass index is 31.68 kg/m.  Physical Exam  Nursing note and vitals reviewed. Constitutional: She is oriented to person, place, and time. She appears well-developed. No distress.  HENT:  Head: Normocephalic and atraumatic.  Right Ear: Hearing, tympanic membrane, external ear and ear canal normal.  Left Ear: Hearing, tympanic membrane, external ear and ear canal normal.  Nose: Rhinorrhea present. Right sinus exhibits no maxillary sinus tenderness and no frontal sinus tenderness. Left sinus exhibits no maxillary sinus tenderness and no frontal sinus tenderness.  Mouth/Throat: Uvula is midline and mucous membranes are normal. Posterior oropharyngeal erythema (mild) present. No  oropharyngeal exudate or posterior oropharyngeal edema.  Postnasal drainage. Hyperemic nasal mucosa. Clear rhinorrhea.  Eyes: Pupils are equal, round, and reactive to light. Conjunctivae are normal.  Neck: No edema and no erythema present.  Cardiovascular: Normal rate and regular rhythm.   No murmur heard. Respiratory: Effort normal and breath sounds normal. No respiratory distress.  Prolonged expiration. Dry cough a few times during visit.  Musculoskeletal: She exhibits no edema.  Lymphadenopathy:       Head (right side): No submandibular adenopathy present.       Head (left side): No submandibular adenopathy present.    She has cervical adenopathy (About 1 cm).       Right cervical: Posterior cervical adenopathy present.       Left cervical: Posterior cervical adenopathy present.  Neurological: She is alert and oriented to person, place, and time. She has normal strength. Coordination and gait normal.  Skin: Skin is warm. No erythema.  Psychiatric: She has a normal mood and affect.     ASSESSMENT AND PLAN:   Ms. Rafeef was seen today for sore throat and headache.  Diagnoses and all orders for this visit:  Mild intermittent asthma, unspecified whether complicated  No wheezing heard today. Albuterol inh 2 puff every 6 hours for a week then as needed for wheezing or shortness of breath.  Prednisone side effects discussed. Instructed about warning signs.  -     predniSONE (DELTASONE) 20 MG tablet; Take 2 tablets (40 mg total) by mouth daily with breakfast.  Exposure to strep throat  Clinical findings do not suggest strep throat, rapid strep negative. We discussed options, including empiric treatment, she just completed abx treatment, so would like to hold until throat Cx.  -     POC Rapid Strep A -     Culture, Group A Strep  URI, acute  Symptoms suggests a viral etiology, so I do not think abx is needed at this time. Instructed to monitor for signs of complications,  clearly instructed about warning signs. I also explained that cough and nasal congestion can last a few days and sometimes weeks. F/U as needed.   Allergic rhinitis, unspecified seasonality, unspecified trigger  Prednisone may also help with symptoms. Nasal irrigation with saline as needed. Continue Zyrtec 10 mg daily and Flonase nasal spray daily until symptom resolved,then prn.  -     predniSONE (DELTASONE) 20 MG tablet; Take 2 tablets (40 mg total) by mouth daily with breakfast.    -Ms. Novella Rob advised to seek attention immediately if symptoms worsen or to follow if they persist or new concerns arise.       Betty G. Martinique, MD  [facility]. [place] office.

## 2017-05-17 LAB — CULTURE, GROUP A STREP
MICRO NUMBER: 81110586
SPECIMEN QUALITY: ADEQUATE

## 2017-05-22 DIAGNOSIS — E559 Vitamin D deficiency, unspecified: Secondary | ICD-10-CM | POA: Diagnosis not present

## 2017-05-22 DIAGNOSIS — E669 Obesity, unspecified: Secondary | ICD-10-CM | POA: Diagnosis not present

## 2017-05-22 DIAGNOSIS — E7800 Pure hypercholesterolemia, unspecified: Secondary | ICD-10-CM | POA: Diagnosis not present

## 2017-05-27 DIAGNOSIS — E669 Obesity, unspecified: Secondary | ICD-10-CM | POA: Diagnosis not present

## 2017-06-05 DIAGNOSIS — E669 Obesity, unspecified: Secondary | ICD-10-CM | POA: Diagnosis not present

## 2017-06-05 DIAGNOSIS — E039 Hypothyroidism, unspecified: Secondary | ICD-10-CM | POA: Diagnosis not present

## 2017-06-05 DIAGNOSIS — E559 Vitamin D deficiency, unspecified: Secondary | ICD-10-CM | POA: Diagnosis not present

## 2017-06-08 DIAGNOSIS — Z01419 Encounter for gynecological examination (general) (routine) without abnormal findings: Secondary | ICD-10-CM | POA: Diagnosis not present

## 2017-06-08 DIAGNOSIS — Z124 Encounter for screening for malignant neoplasm of cervix: Secondary | ICD-10-CM | POA: Diagnosis not present

## 2017-06-08 DIAGNOSIS — Z6830 Body mass index (BMI) 30.0-30.9, adult: Secondary | ICD-10-CM | POA: Diagnosis not present

## 2017-06-08 DIAGNOSIS — Z1231 Encounter for screening mammogram for malignant neoplasm of breast: Secondary | ICD-10-CM | POA: Diagnosis not present

## 2017-06-12 ENCOUNTER — Other Ambulatory Visit: Payer: Self-pay | Admitting: Family Medicine

## 2017-06-12 DIAGNOSIS — E559 Vitamin D deficiency, unspecified: Secondary | ICD-10-CM | POA: Diagnosis not present

## 2017-06-12 DIAGNOSIS — E039 Hypothyroidism, unspecified: Secondary | ICD-10-CM | POA: Diagnosis not present

## 2017-06-12 DIAGNOSIS — E669 Obesity, unspecified: Secondary | ICD-10-CM | POA: Diagnosis not present

## 2017-06-12 DIAGNOSIS — E7800 Pure hypercholesterolemia, unspecified: Secondary | ICD-10-CM | POA: Diagnosis not present

## 2017-06-15 DIAGNOSIS — Z85828 Personal history of other malignant neoplasm of skin: Secondary | ICD-10-CM | POA: Diagnosis not present

## 2017-06-15 DIAGNOSIS — D2262 Melanocytic nevi of left upper limb, including shoulder: Secondary | ICD-10-CM | POA: Diagnosis not present

## 2017-06-15 DIAGNOSIS — D2261 Melanocytic nevi of right upper limb, including shoulder: Secondary | ICD-10-CM | POA: Diagnosis not present

## 2017-06-15 DIAGNOSIS — L720 Epidermal cyst: Secondary | ICD-10-CM | POA: Diagnosis not present

## 2017-06-19 DIAGNOSIS — E559 Vitamin D deficiency, unspecified: Secondary | ICD-10-CM | POA: Diagnosis not present

## 2017-06-19 DIAGNOSIS — E669 Obesity, unspecified: Secondary | ICD-10-CM | POA: Diagnosis not present

## 2017-06-19 DIAGNOSIS — E039 Hypothyroidism, unspecified: Secondary | ICD-10-CM | POA: Diagnosis not present

## 2017-06-19 DIAGNOSIS — E7800 Pure hypercholesterolemia, unspecified: Secondary | ICD-10-CM | POA: Diagnosis not present

## 2017-06-26 DIAGNOSIS — E039 Hypothyroidism, unspecified: Secondary | ICD-10-CM | POA: Diagnosis not present

## 2017-06-26 DIAGNOSIS — E669 Obesity, unspecified: Secondary | ICD-10-CM | POA: Diagnosis not present

## 2017-06-26 DIAGNOSIS — E559 Vitamin D deficiency, unspecified: Secondary | ICD-10-CM | POA: Diagnosis not present

## 2017-06-26 DIAGNOSIS — E7800 Pure hypercholesterolemia, unspecified: Secondary | ICD-10-CM | POA: Diagnosis not present

## 2017-07-01 DIAGNOSIS — E039 Hypothyroidism, unspecified: Secondary | ICD-10-CM | POA: Diagnosis not present

## 2017-07-01 DIAGNOSIS — E559 Vitamin D deficiency, unspecified: Secondary | ICD-10-CM | POA: Diagnosis not present

## 2017-07-01 DIAGNOSIS — E669 Obesity, unspecified: Secondary | ICD-10-CM | POA: Diagnosis not present

## 2017-07-01 DIAGNOSIS — E7800 Pure hypercholesterolemia, unspecified: Secondary | ICD-10-CM | POA: Diagnosis not present

## 2017-07-10 DIAGNOSIS — E669 Obesity, unspecified: Secondary | ICD-10-CM | POA: Diagnosis not present

## 2017-07-10 DIAGNOSIS — E039 Hypothyroidism, unspecified: Secondary | ICD-10-CM | POA: Diagnosis not present

## 2017-07-17 DIAGNOSIS — R635 Abnormal weight gain: Secondary | ICD-10-CM | POA: Diagnosis not present

## 2017-07-17 DIAGNOSIS — E663 Overweight: Secondary | ICD-10-CM | POA: Diagnosis not present

## 2017-07-17 DIAGNOSIS — E039 Hypothyroidism, unspecified: Secondary | ICD-10-CM | POA: Diagnosis not present

## 2017-07-17 DIAGNOSIS — E669 Obesity, unspecified: Secondary | ICD-10-CM | POA: Diagnosis not present

## 2017-07-31 DIAGNOSIS — E663 Overweight: Secondary | ICD-10-CM | POA: Diagnosis not present

## 2017-07-31 DIAGNOSIS — E559 Vitamin D deficiency, unspecified: Secondary | ICD-10-CM | POA: Diagnosis not present

## 2017-07-31 DIAGNOSIS — E039 Hypothyroidism, unspecified: Secondary | ICD-10-CM | POA: Diagnosis not present

## 2017-07-31 DIAGNOSIS — E7800 Pure hypercholesterolemia, unspecified: Secondary | ICD-10-CM | POA: Diagnosis not present

## 2017-08-14 DIAGNOSIS — E559 Vitamin D deficiency, unspecified: Secondary | ICD-10-CM | POA: Diagnosis not present

## 2017-08-14 DIAGNOSIS — E663 Overweight: Secondary | ICD-10-CM | POA: Diagnosis not present

## 2017-08-14 DIAGNOSIS — E039 Hypothyroidism, unspecified: Secondary | ICD-10-CM | POA: Diagnosis not present

## 2017-08-28 DIAGNOSIS — E663 Overweight: Secondary | ICD-10-CM | POA: Diagnosis not present

## 2017-08-28 DIAGNOSIS — E559 Vitamin D deficiency, unspecified: Secondary | ICD-10-CM | POA: Diagnosis not present

## 2017-09-11 DIAGNOSIS — E663 Overweight: Secondary | ICD-10-CM | POA: Diagnosis not present

## 2017-09-11 DIAGNOSIS — E559 Vitamin D deficiency, unspecified: Secondary | ICD-10-CM | POA: Diagnosis not present

## 2017-09-11 DIAGNOSIS — E7800 Pure hypercholesterolemia, unspecified: Secondary | ICD-10-CM | POA: Diagnosis not present

## 2017-09-25 DIAGNOSIS — E663 Overweight: Secondary | ICD-10-CM | POA: Diagnosis not present

## 2017-09-25 DIAGNOSIS — E559 Vitamin D deficiency, unspecified: Secondary | ICD-10-CM | POA: Diagnosis not present

## 2017-09-25 DIAGNOSIS — E7800 Pure hypercholesterolemia, unspecified: Secondary | ICD-10-CM | POA: Diagnosis not present

## 2017-12-08 ENCOUNTER — Encounter: Payer: Self-pay | Admitting: Radiation Oncology

## 2017-12-08 DIAGNOSIS — C3411 Malignant neoplasm of upper lobe, right bronchus or lung: Secondary | ICD-10-CM | POA: Insufficient documentation

## 2017-12-08 DIAGNOSIS — C7931 Secondary malignant neoplasm of brain: Secondary | ICD-10-CM | POA: Insufficient documentation

## 2018-02-18 ENCOUNTER — Encounter: Payer: Self-pay | Admitting: Family Medicine

## 2018-02-18 ENCOUNTER — Ambulatory Visit: Payer: BLUE CROSS/BLUE SHIELD | Admitting: Family Medicine

## 2018-02-18 VITALS — BP 110/76 | HR 66 | Temp 98.0°F | Wt 171.0 lb

## 2018-02-18 DIAGNOSIS — R0982 Postnasal drip: Secondary | ICD-10-CM | POA: Diagnosis not present

## 2018-02-18 DIAGNOSIS — H6983 Other specified disorders of Eustachian tube, bilateral: Secondary | ICD-10-CM

## 2018-02-18 DIAGNOSIS — J029 Acute pharyngitis, unspecified: Secondary | ICD-10-CM | POA: Diagnosis not present

## 2018-02-18 LAB — POCT RAPID STREP A (OFFICE): RAPID STREP A SCREEN: NEGATIVE

## 2018-02-18 MED ORDER — FLUTICASONE PROPIONATE 50 MCG/ACT NA SUSP: 1.0000 | Freq: Every day | NASAL | 0 refills | Status: DC

## 2018-02-18 NOTE — Progress Notes (Signed)
Subjective:    Patient ID: Brandi Sutton, female    DOB: 05/13/1976, 42 y.o.   MRN: [id number]  No chief complaint on file.   HPI Patient was seen today for acute concern.  Pt endorses headache, ear pain/pressure, and sore throat x3 days.  Pt has tried ibuprofen 400 mg with relief until this am.  Pt also endorses dry cough which started this morning.  Pt denies sick contacts, fever, chills, n/v.  Pt with h/o seasonal allergies on zyrtec for yrs.   Past Medical History:  Diagnosis Date  . Allergy   . ASTHMA 03/31/2007   no attack in years per patient   . Asthma    allergy related  . Breast mass, left   . CONTACT DERMATITIS&OTHER ECZEMA DUE TO PLANTS 12/03/2009  . Depression   . DYSFUNCTIONAL UTERINE BLEEDING 01/24/2010  . GERD (gastroesophageal reflux disease)   . Headache    sinus headaches   . HIP PAIN, RIGHT 01/22/2009  . LOW BACK PAIN, ACUTE 04/17/2008    Allergies  Allergen Reactions  . Penicillins Hives and Itching    Has patient had a PCN reaction causing immediate rash, facial/tongue/throat swelling, SOB or lightheadedness with hypotension: no Has patient had a PCN reaction causing severe rash involving mucus membranes or skin necrosis: no Has patient had a PCN reaction that required hospitalization no Has patient had a PCN reaction occurring within the last 10 years: yes If all of the above answers are "NO", then may proceed with Cephalosporin use.   . Wellbutrin [Bupropion] Hives  . Bupropion Hcl Hives and Rash    ROS General: Denies fever, chills, night sweats, changes in weight, changes in appetite HEENT: Denies changes in vision, rhinorrhea   + ear pain/pressure, headache, sore throat   CV: Denies CP, palpitations, SOB, orthopnea Pulm: Denies SOB, wheezing  +dry cough GI: Denies abdominal pain, nausea, vomiting, diarrhea, constipation GU: Denies dysuria, hematuria, frequency, vaginal discharge Msk: Denies muscle cramps, joint pains Neuro: Denies weakness,  numbness, tingling Skin: Denies rashes, bruising Psych: Denies depression, anxiety, hallucinations     Objective:    Blood pressure 110/76, pulse 66, temperature 98 F (36.7 C), temperature source Oral, weight 171 lb (77.6 kg), SpO2 98 %.   Gen. Pleasant, well-nourished, in no distress, normal affect  HEENT: [place]/AT, face symmetric, no scleral icterus, PERRLA, nares patent without drainage, pharynx with mild postnasal drainage, no erythema or exudate.  TMs full bilaterally.  No cervical lymphadenopathy. Lungs: no accessory muscle use, CTAB, no wheezes or rales Cardiovascular: RRR, no m/r/g, no peripheral edema Abdomen: BS present, soft, NT/ND Neuro:  A&Ox3, CN II-XII intact, normal gait   Wt Readings from Last 3 Encounters:  02/18/18 171 lb (77.6 kg)  05/15/17 196 lb 4 oz (89 kg)  03/31/17 207 lb 8 oz (94.1 kg)    Lab Results  Component Value Date   WBC 8.5 03/03/2017   HGB 14.6 03/03/2017   HCT 43.9 03/03/2017   PLT 309.0 03/03/2017   GLUCOSE 91 03/31/2017   CHOL 149 04/16/2012   TRIG 116.0 04/16/2012   HDL 47.00 04/16/2012   LDLCALC 79 04/16/2012   ALT 40 (H) 03/03/2017   AST 29 03/03/2017   NA 140 03/31/2017   K 4.1 03/31/2017   CL 100 03/31/2017   CREATININE 0.89 03/31/2017   BUN 8 03/31/2017   CO2 31 03/31/2017   TSH 2.28 03/03/2017   HGBA1C 5.2 02/16/2014    Assessment/Plan:  Sore throat  -Likely 2/2 postnasal  drainage, allergies, or developing URI -Supportive care -Okay to use Tylenol or ibuprofen as needed for any pain/discomfort. - Plan: POCT rapid strep A negative  Post-nasal drainage  -Given handout -Okay to use Delsym as needed for cough. -Discussed switching from Zyrtec to a different allergy medicine  - Plan: fluticasone (FLONASE) 50 MCG/ACT nasal spray  Eustachian tube dysfunction, bilateral  -Given handout - Plan: fluticasone (FLONASE) 50 MCG/ACT nasal spray  Follow-up PRN  Grier Mitts, MD

## 2018-02-18 NOTE — Patient Instructions (Addendum)
You can use Delsym for your cough.  You can be found over-the-counter at your local pharmacy.  You should also gargle with warm salt water or Chloraseptic spray to help with your throat.  Drinking warm fluids can also help soothe your throat.   Eustachian Tube Dysfunction The eustachian tube connects the middle ear to the back of the nose. It regulates air pressure in the middle ear by allowing air to move between the ear and nose. It also helps to drain fluid from the middle ear space. When the eustachian tube does not function properly, air pressure, fluid, or both can build up in the middle ear. Eustachian tube dysfunction can affect one or both ears. What are the causes? This condition happens when the eustachian tube becomes blocked or cannot open normally. This may result from:  Ear infections.  Colds and other upper respiratory infections.  Allergies.  Irritation, such as from cigarette smoke or acid from the stomach coming up into the esophagus (gastroesophageal reflux).  Sudden changes in air pressure, such as from descending in an airplane.  Abnormal growths in the nose or throat, such as nasal polyps, tumors, or enlarged tissue at the back of the throat (adenoids).  What increases the risk? This condition may be more likely to develop in people who smoke and people who are overweight. Eustachian tube dysfunction may also be more likely to develop in children, especially children who have:  Certain birth defects of the mouth, such as cleft palate.  Large tonsils and adenoids.  What are the signs or symptoms? Symptoms of this condition may include:  A feeling of fullness in the ear.  Ear pain.  Clicking or popping noises in the ear.  Ringing in the ear.  Hearing loss.  Loss of balance.  Symptoms may get worse when the air pressure around you changes, such as when you travel to an area of high elevation or fly on an airplane. How is this diagnosed? This condition  may be diagnosed based on:  Your symptoms.  A physical exam of your ear, nose, and throat.  Tests, such as those that measure: ? The movement of your eardrum (tympanogram). ? Your hearing (audiometry).  How is this treated? Treatment depends on the cause and severity of your condition. If your symptoms are mild, you may be able to relieve your symptoms by moving air into ("popping") your ears. If you have symptoms of fluid in your ears, treatment may include:  Decongestants.  Antihistamines.  Nasal sprays or ear drops that contain medicines that reduce swelling (steroids).  In some cases, you may need to have a procedure to drain the fluid in your eardrum (myringotomy). In this procedure, a small tube is placed in the eardrum to:  Drain the fluid.  Restore the air in the middle ear space.  Follow these instructions at home:  Take over-the-counter and prescription medicines only as told by your health care provider.  Use techniques to help pop your ears as recommended by your health care provider. These may include: ? Chewing gum. ? Yawning. ? Frequent, forceful swallowing. ? Closing your mouth, holding your nose closed, and gently blowing as if you are trying to blow air out of your nose.  Do not do any of the following until your health care provider approves: ? Travel to high altitudes. ? Fly in airplanes. ? Work in a Pension scheme manager or room. ? Scuba dive.  Keep your ears dry. Dry your ears completely after showering  or bathing.  Do not smoke.  Keep all follow-up visits as told by your health care provider. This is important. Contact a health care provider if:  Your symptoms do not go away after treatment.  Your symptoms come back after treatment.  You are unable to pop your ears.  You have: ? A fever. ? Pain in your ear. ? Pain in your head or neck. ? Fluid draining from your ear.  Your hearing suddenly changes.  You become very dizzy.  You lose  your balance. This information is not intended to replace advice given to you by your health care provider. Make sure you discuss any questions you have with your health care provider. Document Released: 08/24/2015 Document Revised: 01/03/2016 Document Reviewed: 08/16/2014 Elsevier Interactive Patient Education  2018 [place].  Sore Throat When you have a sore throat, your throat may:  Hurt.  Burn.  Feel irritated.  Feel scratchy.  Many things can cause a sore throat, including:  An infection.  Allergies.  Dryness in the air.  Smoke or pollution.  Gastroesophageal reflux disease (GERD).  A tumor.  A sore throat can be the first sign of another sickness. It can happen with other problems, like coughing or a fever. Most sore throats go away without treatment. Follow these instructions at home:  Take over-the-counter medicines only as told by your doctor.  Drink enough fluids to keep your pee (urine) clear or pale yellow.  Rest when you feel you need to.  To help with pain, try: ? Sipping warm liquids, such as broth, herbal tea, or warm water. ? Eating or drinking cold or frozen liquids, such as frozen ice pops. ? Gargling with a salt-water mixture 3-4 times a day or as needed. To make a salt-water mixture, add -1 tsp of salt in 1 cup of warm water. Mix it until you cannot see the salt anymore. ? Sucking on hard candy or throat lozenges. ? Putting a cool-mist humidifier in your bedroom at night. ? Sitting in the bathroom with the door closed for 5-10 minutes while you run hot water in the shower.  Do not use any tobacco products, such as cigarettes, chewing tobacco, and e-cigarettes. If you need help quitting, ask your doctor. Contact a doctor if:  You have a fever for more than 2-3 days.  You keep having symptoms for more than 2-3 days.  Your throat does not get better in 7 days.  You have a fever and your symptoms suddenly get worse. Get help right away  if:  You have trouble breathing.  You cannot swallow fluids, soft foods, or your saliva.  You have swelling in your throat or neck that gets worse.  You keep feeling like you are going to throw up (vomit).  You keep throwing up. This information is not intended to replace advice given to you by your health care provider. Make sure you discuss any questions you have with your health care provider. Document Released: 05/06/2008 Document Revised: 03/23/2016 Document Reviewed: 05/18/2015 Elsevier Interactive Patient Education  2018 [place] Drip Postnasal drip is the feeling of mucus going down the back of your throat. Mucus is a slimy substance that moistens and cleans your nose and throat, as well as the air pockets in face bones near your forehead and cheeks (sinuses). Small amounts of mucus pass from your nose and sinuses down the back of your throat all the time. This is normal. When you produce too much mucus or  the mucus gets too thick, you can feel it. Some common causes of postnasal drip include:  Having more mucus because of: ? A cold or the flu. ? Allergies. ? Cold air. ? Certain medicines.  Having more mucus that is thicker because of: ? A sinus or nasal infection. ? Dry air. ? A food allergy.  Follow these instructions at home: Relieving discomfort  Gargle with a salt-water mixture 3-4 times a day or as needed. To make a salt-water mixture, completely dissolve -1 tsp of salt in 1 cup of warm water.  If the air in your home is dry, use a humidifier to add moisture to the air.  Use a saline spray or container (neti pot) to flush out the nose (nasal irrigation). These methods can help clear away mucus and keep the nasal passages moist. General instructions  Take over-the-counter and prescription medicines only as told by your health care provider.  Follow instructions from your health care provider about eating or drinking restrictions. You may need  to avoid caffeine.  Avoid things that you know you are allergic to (allergens), like dust, mold, pollen, pets, or certain foods.  Drink enough fluid to keep your urine pale yellow.  Keep all follow-up visits as told by your health care provider. This is important. Contact a health care provider if:  You have a fever.  You have a sore throat.  You have difficulty swallowing.  You have headache.  You have sinus pain.  You have a cough that does not go away.  The mucus from your nose becomes thick and is green or yellow in color.  You have cold or flu symptoms that last more than 10 days. Summary  Postnasal drip is the feeling of mucus going down the back of your throat.  If your health care provider approves, use nasal irrigation or a nasal spray 2?4 times a day.  Avoid things that you know you are allergic to (allergens), like dust, mold, pollen, pets, or certain foods. This information is not intended to replace advice given to you by your health care provider. Make sure you discuss any questions you have with your health care provider. Document Released: 11/10/2016 Document Revised: 11/10/2016 Document Reviewed: 11/10/2016 Elsevier Interactive Patient Education  [employer].

## 2018-05-19 ENCOUNTER — Other Ambulatory Visit: Payer: Self-pay | Admitting: Obstetrics and Gynecology

## 2018-05-19 DIAGNOSIS — R102 Pelvic and perineal pain: Secondary | ICD-10-CM | POA: Diagnosis not present

## 2018-05-19 DIAGNOSIS — R2232 Localized swelling, mass and lump, left upper limb: Secondary | ICD-10-CM

## 2018-05-19 DIAGNOSIS — Z6827 Body mass index (BMI) 27.0-27.9, adult: Secondary | ICD-10-CM | POA: Diagnosis not present

## 2018-05-25 ENCOUNTER — Ambulatory Visit
Admission: RE | Admit: 2018-05-25 | Discharge: 2018-05-25 | Disposition: A | Discharge status: Home / Self Care | Payer: BLUE CROSS/BLUE SHIELD | Source: Ambulatory Visit | Attending: Obstetrics and Gynecology | Admitting: Obstetrics and Gynecology

## 2018-05-25 DIAGNOSIS — R2232 Localized swelling, mass and lump, left upper limb: Secondary | ICD-10-CM

## 2018-05-25 DIAGNOSIS — R928 Other abnormal and inconclusive findings on diagnostic imaging of breast: Secondary | ICD-10-CM | POA: Diagnosis not present

## 2018-05-25 DIAGNOSIS — N644 Mastodynia: Secondary | ICD-10-CM | POA: Diagnosis not present

## 2018-06-15 ENCOUNTER — Ambulatory Visit (INDEPENDENT_AMBULATORY_CARE_PROVIDER_SITE_OTHER): Payer: BLUE CROSS/BLUE SHIELD | Admitting: Family Medicine

## 2018-06-15 ENCOUNTER — Encounter: Payer: Self-pay | Admitting: Family Medicine

## 2018-06-15 VITALS — BP 100/60 | HR 75 | Temp 97.6°F | Ht 65.0 in | Wt 178.3 lb

## 2018-06-15 DIAGNOSIS — J309 Allergic rhinitis, unspecified: Secondary | ICD-10-CM | POA: Diagnosis not present

## 2018-06-15 DIAGNOSIS — Z136 Encounter for screening for cardiovascular disorders: Secondary | ICD-10-CM | POA: Diagnosis not present

## 2018-06-15 DIAGNOSIS — Z0000 Encounter for general adult medical examination without abnormal findings: Secondary | ICD-10-CM | POA: Diagnosis not present

## 2018-06-15 DIAGNOSIS — R109 Unspecified abdominal pain: Secondary | ICD-10-CM

## 2018-06-15 LAB — POCT URINALYSIS DIPSTICK
BILIRUBIN UA: NEGATIVE
Blood, UA: NEGATIVE
Glucose, UA: NEGATIVE
Ketones, UA: NEGATIVE
LEUKOCYTES UA: NEGATIVE
Nitrite, UA: NEGATIVE
PH UA: 6 (ref 5.0–8.0)
Protein, UA: NEGATIVE
Spec Grav, UA: 1.02 (ref 1.010–1.025)
UROBILINOGEN UA: 0.2 U/dL

## 2018-06-15 MED ORDER — CHOLESTYRAMINE LIGHT 4 G PO PACK: 4.0000 g | PACK | Freq: Two times a day (BID) | ORAL | 11 refills | Status: DC

## 2018-06-15 NOTE — Patient Instructions (Signed)
Labs today............ I will call if there is anything abnormal  Begin the cholestyramine...Marland KitchenMarland Kitchen 1 pack daily for 2 weeks then increase to 1 pack twice daily  If this does not resolve your symptoms then I would reconsult with Dr. Loletha Carrow  Thank you for coming to see me all these years.  My best to you and your family in good luck in the future

## 2018-06-15 NOTE — Progress Notes (Signed)
Brandi Sutton is a delightful 42 year old married female non-smoker comes in today for general physical examination for the following reasons  3 years ago she had a cholecystectomy.  Subsequent to that she has had changes in her bowel habits.  Sometimes she has constipation sometimes diarrhea.  She has had some bloating.  She went to see her gynecologist 6 weeks ago.  Dr. Ouida Sills did not find anything abnormal on her physical examination.  Ultrasound of the pelvis was normal.  She is also been seen by Dr. Loletha Carrow.  She had a colonoscopy 2 years ago.  Colonoscopy was normal.  She is also been seen by dermatology because of basal cell carcinomas.  She gets routine eye care, dental care, BSE monthly, annual mammography, colonoscopy as noted above 2 years ago by Dr. Simona Huh was normal  Vaccinations up-to-date.  She declines a flu vaccine.  She uses Flonase and Claritin for allergic rhinitis  She takes Prevacid 50 mg daily because a history of reflux esophagitis.  She was given Levsin by Dr. Simona Huh for IBS but it does not help.  Family history unchanged social history unchanged  14 point review of system reviewed otherwise negative except for above  BP 100/60 (BP Location: Right Arm, Patient Position: Sitting, Cuff Size: Large)   Pulse 75   Temp 97.6 F (36.4 C) (Oral)   Ht 5\' 5"  (1.651 m)   Wt 178 lb 4.8 oz (80.9 kg)   SpO2 97%   BMI 29.67 kg/m  Well-developed well-nourished female no acute distress vital signs stable she is afebrile examination HEENT were negative neck was supple thyroid is not enlarged.  Cardiopulmonary exam normal.  Breast exam normal.  Abdominal exam was normal.  Pelvic and rectal by GYN therefore not repeated.  Extremities normal skin normal peripheral pulses normal.  Slight scar on her nose from previous BCE removal.  1.  Healthy female  2.  Abdominal pain change in bowel habits after cholecystectomy 3 years ago......... trial of Questran  3.  Allergic rhinitis.......  continue OTC meds  4.  History of skin cancer....... continue followed by dermatology

## 2018-06-16 DIAGNOSIS — D485 Neoplasm of uncertain behavior of skin: Secondary | ICD-10-CM | POA: Diagnosis not present

## 2018-06-16 DIAGNOSIS — D2239 Melanocytic nevi of other parts of face: Secondary | ICD-10-CM | POA: Diagnosis not present

## 2018-06-16 DIAGNOSIS — Z85828 Personal history of other malignant neoplasm of skin: Secondary | ICD-10-CM | POA: Diagnosis not present

## 2018-06-16 DIAGNOSIS — D225 Melanocytic nevi of trunk: Secondary | ICD-10-CM | POA: Diagnosis not present

## 2018-06-16 DIAGNOSIS — D2261 Melanocytic nevi of right upper limb, including shoulder: Secondary | ICD-10-CM | POA: Diagnosis not present

## 2018-06-16 DIAGNOSIS — L814 Other melanin hyperpigmentation: Secondary | ICD-10-CM | POA: Diagnosis not present

## 2018-06-16 DIAGNOSIS — C4441 Basal cell carcinoma of skin of scalp and neck: Secondary | ICD-10-CM | POA: Diagnosis not present

## 2018-06-16 LAB — BASIC METABOLIC PANEL
BUN: 11 mg/dL (ref 6–23)
CO2: 25 mEq/L (ref 19–32)
Calcium: 9.3 mg/dL (ref 8.4–10.5)
Chloride: 105 mEq/L (ref 96–112)
Creatinine, Ser: 0.87 mg/dL (ref 0.40–1.20)
GFR: 74.89 mL/min (ref >60.00)
GLUCOSE: 90 mg/dL (ref 70–99)
POTASSIUM: 3.8 meq/L (ref 3.5–5.1)
Sodium: 139 mEq/L (ref 135–145)

## 2018-06-16 LAB — CBC WITH DIFFERENTIAL/PLATELET
BASOS ABS: 0.1 10*3/uL (ref 0.0–0.1)
Basophils Relative: 1 % (ref 0.0–3.0)
EOS ABS: 0.2 10*3/uL (ref 0.0–0.7)
Eosinophils Relative: 1.8 % (ref 0.0–5.0)
HEMATOCRIT: 42.4 % (ref 36.0–46.0)
HEMOGLOBIN: 14.6 g/dL (ref 12.0–15.0)
LYMPHS PCT: 19.7 % (ref 12.0–46.0)
Lymphs Abs: 1.9 10*3/uL (ref 0.7–4.0)
MCHC: 34.5 g/dL (ref 30.0–36.0)
MCV: 91.4 fl (ref 78.0–100.0)
Monocytes Absolute: 0.5 10*3/uL (ref 0.1–1.0)
Monocytes Relative: 5.2 % (ref 3.0–12.0)
Neutro Abs: 6.8 10*3/uL (ref 1.4–7.7)
Neutrophils Relative %: 72.3 % (ref 43.0–77.0)
Platelets: 264 10*3/uL (ref 150.0–400.0)
RBC: 4.63 Mil/uL (ref 3.87–5.11)
RDW: 12.9 % (ref 11.5–15.5)
WBC: 9.4 10*3/uL (ref 4.0–10.5)

## 2018-06-16 LAB — HEPATIC FUNCTION PANEL
ALT: 16 U/L (ref 0–35)
AST: 15 U/L (ref 0–37)
Albumin: 4.3 g/dL (ref 3.5–5.2)
Alkaline Phosphatase: 52 U/L (ref 39–117)
Bilirubin, Direct: 0.1 mg/dL (ref 0.0–0.3)
Total Bilirubin: 0.5 mg/dL (ref 0.2–1.2)
Total Protein: 6.4 g/dL (ref 6.0–8.3)

## 2018-06-16 LAB — LIPID PANEL
CHOL/HDL RATIO: 3
CHOLESTEROL: 162 mg/dL (ref 0–200)
HDL: 53.2 mg/dL (ref >39.00)
LDL CALC: 81 mg/dL (ref 0–99)
NONHDL: 108.5
Triglycerides: 139 mg/dL (ref 0.0–149.0)
VLDL: 27.8 mg/dL (ref 0.0–40.0)

## 2018-06-16 LAB — TSH: TSH: 2.07 u[IU]/mL (ref 0.35–4.50)

## 2018-06-30 DIAGNOSIS — Z6828 Body mass index (BMI) 28.0-28.9, adult: Secondary | ICD-10-CM | POA: Diagnosis not present

## 2018-06-30 DIAGNOSIS — Z113 Encounter for screening for infections with a predominantly sexual mode of transmission: Secondary | ICD-10-CM | POA: Diagnosis not present

## 2018-06-30 DIAGNOSIS — Z124 Encounter for screening for malignant neoplasm of cervix: Secondary | ICD-10-CM | POA: Diagnosis not present

## 2018-06-30 DIAGNOSIS — Z1231 Encounter for screening mammogram for malignant neoplasm of breast: Secondary | ICD-10-CM | POA: Diagnosis not present

## 2018-06-30 DIAGNOSIS — Z01419 Encounter for gynecological examination (general) (routine) without abnormal findings: Secondary | ICD-10-CM | POA: Diagnosis not present

## 2018-07-06 DIAGNOSIS — D485 Neoplasm of uncertain behavior of skin: Secondary | ICD-10-CM | POA: Diagnosis not present

## 2018-07-06 DIAGNOSIS — C4441 Basal cell carcinoma of skin of scalp and neck: Secondary | ICD-10-CM | POA: Diagnosis not present

## 2018-07-06 DIAGNOSIS — L988 Other specified disorders of the skin and subcutaneous tissue: Secondary | ICD-10-CM | POA: Diagnosis not present

## 2018-07-16 ENCOUNTER — Ambulatory Visit: Payer: BLUE CROSS/BLUE SHIELD | Admitting: Gastroenterology

## 2018-07-16 ENCOUNTER — Encounter: Payer: Self-pay | Admitting: Gastroenterology

## 2018-07-16 VITALS — BP 128/76 | HR 83 | Ht 66.0 in | Wt 176.0 lb

## 2018-07-16 DIAGNOSIS — K582 Mixed irritable bowel syndrome: Secondary | ICD-10-CM

## 2018-07-16 DIAGNOSIS — R102 Pelvic and perineal pain: Secondary | ICD-10-CM | POA: Diagnosis not present

## 2018-07-16 NOTE — Progress Notes (Signed)
Brandi Sutton GI Progress Note  Chief Complaint: Pelvic pain  Subjective  History:  Brandi Sutton was sent by her primary care doctor and gynecologist for evaluation of several months of pelvic pain.  It was of slow onset, typically occurs on the right side of the suprapubic area and radiating down into the groin.  It sometimes wakes her from sleep and is sharp, and seems to be slowly getting worse.  She has had pain after intercourse, denies urinary symptoms.  She still has the tendencies to alternating constipation and diarrhea as before. She was last seen in September 2017 with history that sounded like mild underlying IBS exacerbated by bile acid diarrhea after cholecystectomy.  Levsin was prescribed, but she never took it because she was concerned about possible side effects. Last colonoscopy November 2017 with diverticulosis and no polyps.   She has had bloating with alternating constipation and diarrhea, and her primary care provider recently prescribed Questran, which seemed to decrease episodes of diarrhea but she does not like the taste.  She was recently seen by her gynecologist twice, but I am not able to read the notes in the EMR.  She tells me a pelvic ultrasound was done and was normal.  On the last visit it seems she may have been treated for bacterial vaginosis, and she got the message through their clinical staff that after evaluation with me, they might consider laparoscopy.  She is not certain if that is a final decision.  Brandi Sutton tells me that the pain is usually on the right side of the pelvis/suprapubic area, radiating down to the groin and sometimes in the low back toward the right side.  ROS: Cardiovascular:  no chest pain Respiratory: no dyspnea Remainder of systems negative except as above The patient's Past Medical, Family and Social History were reviewed and are on file in the EMR.  Objective:  Med list reviewed  Current Outpatient Medications:  .   acetaminophen (TYLENOL) 500 MG tablet, Take 1,000 mg by mouth as needed (For pain.). , Disp: , Rfl:  .  cetirizine (ZYRTEC) 10 MG tablet, Take 10 mg by mouth daily., Disp: , Rfl:  .  cholestyramine light (PREVALITE) 4 g packet, Take 1 packet (4 g total) by mouth 2 (two) times daily., Disp: 60 packet, Rfl: 11 .  fluticasone (FLONASE) 50 MCG/ACT nasal spray, Place 1 spray into both nostrils daily., Disp: 16 g, Rfl: 0 .  hyoscyamine (LEVSIN SL) 0.125 MG SL tablet, Place 1 tablet (0.125 mg total) under the tongue every 6 (six) hours as needed., Disp: 20 tablet, Rfl: 1 .  lansoprazole (PREVACID) 15 MG capsule, Take 15 mg by mouth daily. , Disp: , Rfl:    Vital signs in last 24 hrs: Vitals:   07/16/18 1331  BP: 128/76  Pulse: 83  SpO2: 98%    Physical Exam  She is well-appearing pleasant and conversational  HEENT: sclera anicteric, oral mucosa moist without lesions  Neck: supple, no thyromegaly, JVD or lymphadenopathy  Cardiac: RRR without murmurs, S1S2 heard, no peripheral edema  Pulm: clear to auscultation bilaterally, normal RR and effort noted  Abdomen: soft, suprapubic tenderness, more toward the right, with active bowel sounds. No guarding or palpable hepatosplenomegaly.  Skin; warm and dry, no jaundice or rash Rectal: Normal external, normal sphincter tone, no tenderness, soft stool in rectal vault, no sacral tenderness, light brown heme-negative stool in rectal vault.    @ASSESSMENTPLANBEGIN @ Assessment: Encounter Diagnoses  Name Primary?  . Pelvic pain Yes  .  Irritable bowel syndrome with both constipation and diarrhea    While she does have underlying mild IBS, I do not think that is the cause of this pain.  While I do not know the particular cause, it sounds gynecologic.  She has had ultrasound but no cross-sectional imaging.  Plan: CT scan pelvis with oral and IV contrast Return to gynecology Stop Questran, consider Levsin for cramps and diarrhea.   Total time  30 minutes, over half spent face-to-face with patient in counseling and coordination of care.   Nelida Meuse III

## 2018-07-16 NOTE — Patient Instructions (Signed)
If you are age 44 or older, your body mass index should be between 23-30. Your Body mass index is 28.41 kg/m. If this is out of the aforementioned range listed, please consider follow up with your Primary Care Provider.  If you are age 92 or younger, your body mass index should be between 19-25. Your Body mass index is 28.41 kg/m. If this is out of the aformentioned range listed, please consider follow up with your Primary Care Provider.   You have been scheduled for a CT scan of the abdomen and pelvis at [facility] ([address]---this is in the same building as [facility]).   You are scheduled on            At                . You should arrive 15 minutes prior to your appointment time for registration. Please follow the written instructions below on the day of your exam:  WARNING: IF YOU ARE ALLERGIC TO IODINE/X-RAY DYE, PLEASE NOTIFY RADIOLOGY IMMEDIATELY AT [phone]! YOU WILL BE GIVEN A 13 HOUR PREMEDICATION PREP.  1) Do not eat or drink anything after            (4 hours prior to your test) 2) You have been given 2 bottles of oral contrast to drink. The solution may taste better if refrigerated, but do NOT add ice or any other liquid to this solution. Shake well before drinking.    Drink 1 bottle of contrast @             (2 hours prior to your exam)  Drink 1 bottle of contrast @             (1 hour prior to your exam)  You may take any medications as prescribed with a small amount of water, if necessary. If you take any of the following medications: METFORMIN, GLUCOPHAGE, GLUCOVANCE, AVANDAMET, RIOMET, FORTAMET, [place] MET, JANUMET, GLUMETZA or METAGLIP, you MAY be asked to HOLD this medication 48 hours AFTER the exam.  The purpose of you drinking the oral contrast is to aid in the visualization of your intestinal tract. The contrast solution may cause some diarrhea. Depending on your individual set of symptoms, you may also receive an intravenous injection  of x-ray contrast/dye. Plan on being at [facility] for 30 minutes or longer, depending on the type of exam you are having performed.  This test typically takes 30-45 minutes to complete.  If you have any questions regarding your exam or if you need to reschedule, you may call the CT department at [phone] between the hours of 8:00 am and 5:00 pm, Monday-Friday.  ________________________________________________________________________ It was a pleasure to see you today!  Dr. Loletha Carrow

## 2018-07-27 DIAGNOSIS — K5730 Diverticulosis of large intestine without perforation or abscess without bleeding: Secondary | ICD-10-CM | POA: Diagnosis not present

## 2018-07-27 DIAGNOSIS — N83201 Unspecified ovarian cyst, right side: Secondary | ICD-10-CM | POA: Diagnosis not present

## 2018-07-27 DIAGNOSIS — N838 Other noninflammatory disorders of ovary, fallopian tube and broad ligament: Secondary | ICD-10-CM | POA: Diagnosis not present

## 2018-07-30 ENCOUNTER — Inpatient Hospital Stay: Admission: RE | Admit: 2018-07-30 | Payer: BLUE CROSS/BLUE SHIELD | Source: Ambulatory Visit

## 2018-10-07 NOTE — Patient Instructions (Addendum)
Brandi Sutton  10/07/2018      Your procedure is scheduled on 10-14-2018   Report to [place]  at  10:00A.M.   Call this number if you have problems the morning of surgery:[phone]   OUR ADDRESS IS [place], WE ARE LOCATED IN THE [facility] WITH [facility].   Remember:  Do not eat food or drink liquids after midnight.    Take these medicines the morning of surgery with A SIP OF WATER: omeprazole, Flonase if needed, albuterol inhaler if needed and bring inhaler with you     Do not wear jewelry, make-up or nail polish.  Do not wear lotions, powders, or perfumes, or deoderant.  Do not shave 48 hours prior to surgery.  Men may shave face and neck.  Do not bring valuables to the hospital.  [facility] is not responsible for any belongings or valuables.  Contacts, dentures or bridgework may not be worn into surgery.  Leave your suitcase in the car.  After surgery it may be brought to your room.  For patients admitted to the hospital, discharge time will be determined by your treatment team.  Patients discharged the day of surgery will not be allowed to drive home.   Special instructions:  n/a  Please read over the following fact sheets that you were given:       [facility] - Preparing for Surgery Before surgery, you can play an important role.  Because skin is not sterile, your skin needs to be as free of germs as possible.  You can reduce the number of germs on your skin by washing with CHG (chlorahexidine gluconate) soap before surgery.  CHG is an antiseptic cleaner which kills germs and bonds with the skin to continue killing germs even after washing. Please DO NOT use if you have an allergy to CHG or antibacterial soaps.  If your skin becomes reddened/irritated stop using the CHG and inform your nurse when you arrive at Short Stay. Do not shave (including legs and underarms) for at least 48 hours prior to the first CHG shower.  You  may shave your face/neck. Please follow these instructions carefully:  1.  Shower with CHG Soap the night before surgery and the  morning of Surgery.  2.  If you choose to wash your hair, wash your hair first as usual with your  normal  shampoo.  3.  After you shampoo, rinse your hair and body thoroughly to remove the  shampoo.                           4.  Use CHG as you would any other liquid soap.  You can apply chg directly  to the skin and wash                       Gently with a scrungie or clean washcloth.  5.  Apply the CHG Soap to your body ONLY FROM THE NECK DOWN.   Do not use on face/ open                           Wound or open sores. Avoid contact with eyes, ears mouth and genitals (private parts).                       Wash face,  Genitals (private parts) with your normal soap.             6.  Wash thoroughly, paying special attention to the area where your surgery  will be performed.  7.  Thoroughly rinse your body with warm water from the neck down.  8.  DO NOT shower/wash with your normal soap after using and rinsing off  the CHG Soap.                9.  Pat yourself dry with a clean towel.            10.  Wear clean pajamas.            11.  Place clean sheets on your bed the night of your first shower and do not  sleep with pets. Day of Surgery : Do not apply any lotions/deodorants the morning of surgery.  Please wear clean clothes to the hospital/surgery center.  FAILURE TO FOLLOW THESE INSTRUCTIONS MAY RESULT IN THE CANCELLATION OF YOUR SURGERY PATIENT SIGNATURE_________________________________  NURSE SIGNATURE__________________________________  ________________________________________________________________________

## 2018-10-08 ENCOUNTER — Other Ambulatory Visit: Payer: Self-pay | Admitting: Obstetrics and Gynecology

## 2018-10-08 ENCOUNTER — Encounter (HOSPITAL_COMMUNITY)
Admission: RE | Admit: 2018-10-08 | Discharge: 2018-10-08 | Disposition: A | Discharge status: Home / Self Care | Payer: 59 | Source: Ambulatory Visit | Attending: Obstetrics and Gynecology | Admitting: Obstetrics and Gynecology

## 2018-10-08 ENCOUNTER — Encounter (HOSPITAL_COMMUNITY): Payer: Self-pay

## 2018-10-08 ENCOUNTER — Other Ambulatory Visit: Payer: Self-pay

## 2018-10-08 DIAGNOSIS — Z01812 Encounter for preprocedural laboratory examination: Secondary | ICD-10-CM | POA: Insufficient documentation

## 2018-10-08 LAB — BASIC METABOLIC PANEL
Anion gap: 10 (ref 5–15)
BUN: 9 mg/dL (ref 6–20)
CO2: 24 mmol/L (ref 22–32)
Calcium: 9 mg/dL (ref 8.9–10.3)
Chloride: 106 mmol/L (ref 98–111)
Creatinine, Ser: 0.76 mg/dL (ref 0.44–1.00)
GFR calc Af Amer: >60 mL/min (ref >60)
GFR calc non Af Amer: >60 mL/min (ref >60)
Glucose, Bld: 94 mg/dL (ref 70–99)
Potassium: 3.7 mmol/L (ref 3.5–5.1)
Sodium: 140 mmol/L (ref 135–145)

## 2018-10-08 LAB — CBC
HCT: 41.9 % (ref 36.0–46.0)
Hemoglobin: 13.9 g/dL (ref 12.0–15.0)
MCH: 31 pg (ref 26.0–34.0)
MCHC: 33.2 g/dL (ref 30.0–36.0)
MCV: 93.5 fL (ref 80.0–100.0)
Platelets: 246 10*3/uL (ref 150–400)
RBC: 4.48 MIL/uL (ref 3.87–5.11)
RDW: 11.9 % (ref 11.5–15.5)
WBC: 7.1 10*3/uL (ref 4.0–10.5)
nRBC: 0 % (ref 0.0–0.2)

## 2018-10-14 ENCOUNTER — Ambulatory Visit (HOSPITAL_BASED_OUTPATIENT_CLINIC_OR_DEPARTMENT_OTHER): Payer: 59 | Admitting: Physician Assistant

## 2018-10-14 ENCOUNTER — Encounter (HOSPITAL_BASED_OUTPATIENT_CLINIC_OR_DEPARTMENT_OTHER)
Admission: RE | Disposition: A | Discharge status: Home / Self Care | Payer: Self-pay | Source: Home / Self Care | Attending: Obstetrics and Gynecology

## 2018-10-14 ENCOUNTER — Encounter (HOSPITAL_BASED_OUTPATIENT_CLINIC_OR_DEPARTMENT_OTHER): Payer: Self-pay | Admitting: *Deleted

## 2018-10-14 ENCOUNTER — Ambulatory Visit (HOSPITAL_BASED_OUTPATIENT_CLINIC_OR_DEPARTMENT_OTHER): Payer: 59 | Admitting: Anesthesiology

## 2018-10-14 ENCOUNTER — Other Ambulatory Visit: Payer: Self-pay

## 2018-10-14 ENCOUNTER — Ambulatory Visit (HOSPITAL_BASED_OUTPATIENT_CLINIC_OR_DEPARTMENT_OTHER)
Admission: RE | Admit: 2018-10-14 | Discharge: 2018-10-15 | Disposition: A | Discharge status: Home / Self Care | Payer: 59 | Attending: Obstetrics and Gynecology | Admitting: Obstetrics and Gynecology

## 2018-10-14 DIAGNOSIS — D260 Other benign neoplasm of cervix uteri: Secondary | ICD-10-CM | POA: Diagnosis not present

## 2018-10-14 DIAGNOSIS — Z882 Allergy status to sulfonamides status: Secondary | ICD-10-CM | POA: Diagnosis not present

## 2018-10-14 DIAGNOSIS — K219 Gastro-esophageal reflux disease without esophagitis: Secondary | ICD-10-CM | POA: Diagnosis not present

## 2018-10-14 DIAGNOSIS — D259 Leiomyoma of uterus, unspecified: Secondary | ICD-10-CM | POA: Insufficient documentation

## 2018-10-14 DIAGNOSIS — N888 Other specified noninflammatory disorders of cervix uteri: Secondary | ICD-10-CM | POA: Insufficient documentation

## 2018-10-14 DIAGNOSIS — G8929 Other chronic pain: Secondary | ICD-10-CM | POA: Diagnosis present

## 2018-10-14 DIAGNOSIS — R102 Pelvic and perineal pain: Secondary | ICD-10-CM

## 2018-10-14 DIAGNOSIS — Z79899 Other long term (current) drug therapy: Secondary | ICD-10-CM | POA: Insufficient documentation

## 2018-10-14 DIAGNOSIS — Z880 Allergy status to penicillin: Secondary | ICD-10-CM | POA: Insufficient documentation

## 2018-10-14 HISTORY — PX: LAPAROSCOPIC VAGINAL HYSTERECTOMY WITH SALPINGECTOMY: SHX6680

## 2018-10-14 LAB — POCT PREGNANCY, URINE: Preg Test, Ur: NEGATIVE

## 2018-10-14 SURGERY — HYSTERECTOMY, VAGINAL, LAPAROSCOPY-ASSISTED, WITH SALPINGECTOMY
Anesthesia: General | Site: Abdomen | Laterality: Bilateral

## 2018-10-14 MED ORDER — FENTANYL CITRATE (PF) 250 MCG/5ML IJ SOLN
INTRAMUSCULAR | Status: AC
  Filled 2018-10-14: qty 5

## 2018-10-14 MED ORDER — DEXAMETHASONE SODIUM PHOSPHATE 10 MG/ML IJ SOLN
INTRAMUSCULAR | Status: DC | PRN
  Administered 2018-10-14: 10 mg via INTRAVENOUS

## 2018-10-14 MED ORDER — MIDAZOLAM HCL 2 MG/2ML IJ SOLN
INTRAMUSCULAR | Status: DC | PRN
  Administered 2018-10-14: 2 mg via INTRAVENOUS

## 2018-10-14 MED ORDER — OXYCODONE-ACETAMINOPHEN 5-325 MG PO TABS
1.0000 | ORAL_TABLET | ORAL | Status: DC | PRN
  Administered 2018-10-14: 2 via ORAL
  Administered 2018-10-14: 1 via ORAL
  Administered 2018-10-15: 2 via ORAL
  Administered 2018-10-15: 1 via ORAL
  Administered 2018-10-15: 2 via ORAL
  Filled 2018-10-14: qty 2

## 2018-10-14 MED ORDER — KETOROLAC TROMETHAMINE 30 MG/ML IJ SOLN
INTRAMUSCULAR | Status: AC
  Filled 2018-10-14: qty 1

## 2018-10-14 MED ORDER — ACETAMINOPHEN 325 MG PO TABS
ORAL_TABLET | ORAL | Status: DC | PRN
  Administered 2018-10-14: 1000 mg via ORAL

## 2018-10-14 MED ORDER — MIDAZOLAM HCL 2 MG/2ML IJ SOLN
INTRAMUSCULAR | Status: AC
  Filled 2018-10-14: qty 2

## 2018-10-14 MED ORDER — PROMETHAZINE HCL 25 MG/ML IJ SOLN
6.2500 mg | INTRAMUSCULAR | Status: DC | PRN
  Filled 2018-10-14: qty 1

## 2018-10-14 MED ORDER — HYDROMORPHONE HCL 2 MG PO TABS
ORAL_TABLET | ORAL | Status: AC
  Filled 2018-10-14: qty 1

## 2018-10-14 MED ORDER — HYDROMORPHONE HCL 1 MG/ML IJ SOLN
INTRAMUSCULAR | Status: AC
  Filled 2018-10-14: qty 1

## 2018-10-14 MED ORDER — SUGAMMADEX SODIUM 200 MG/2ML IV SOLN
INTRAVENOUS | Status: DC | PRN
  Administered 2018-10-14: 200 mg via INTRAVENOUS

## 2018-10-14 MED ORDER — SUGAMMADEX SODIUM 200 MG/2ML IV SOLN
INTRAVENOUS | Status: AC
  Filled 2018-10-14: qty 2

## 2018-10-14 MED ORDER — PANTOPRAZOLE SODIUM 40 MG PO TBEC
40.0000 mg | DELAYED_RELEASE_TABLET | Freq: Every day | ORAL | Status: DC
  Administered 2018-10-14: 40 mg via ORAL
  Filled 2018-10-14: qty 1

## 2018-10-14 MED ORDER — GENTAMICIN SULFATE 40 MG/ML IJ SOLN
5.0000 mg/kg | INTRAVENOUS | Status: AC
  Administered 2018-10-14: 340 mg via INTRAVENOUS
  Filled 2018-10-14: qty 8.5

## 2018-10-14 MED ORDER — ONDANSETRON HCL 4 MG/2ML IJ SOLN
INTRAMUSCULAR | Status: AC
  Filled 2018-10-14: qty 2

## 2018-10-14 MED ORDER — ONDANSETRON HCL 4 MG PO TABS
4.0000 mg | ORAL_TABLET | Freq: Four times a day (QID) | ORAL | Status: DC | PRN
  Filled 2018-10-14: qty 1

## 2018-10-14 MED ORDER — KETOROLAC TROMETHAMINE 30 MG/ML IJ SOLN
30.0000 mg | Freq: Once | INTRAMUSCULAR | Status: DC | PRN
  Filled 2018-10-14: qty 1

## 2018-10-14 MED ORDER — ROCURONIUM BROMIDE 100 MG/10ML IV SOLN
INTRAVENOUS | Status: AC
  Filled 2018-10-14: qty 1

## 2018-10-14 MED ORDER — SCOPOLAMINE 1 MG/3DAYS TD PT72
MEDICATED_PATCH | TRANSDERMAL | Status: AC
  Filled 2018-10-14: qty 1

## 2018-10-14 MED ORDER — OXYCODONE-ACETAMINOPHEN 5-325 MG PO TABS
ORAL_TABLET | ORAL | Status: AC
  Filled 2018-10-14: qty 1

## 2018-10-14 MED ORDER — ONDANSETRON HCL 4 MG/2ML IJ SOLN
INTRAMUSCULAR | Status: DC | PRN
  Administered 2018-10-14: 4 mg via INTRAVENOUS

## 2018-10-14 MED ORDER — OXYCODONE HCL 5 MG/5ML PO SOLN
5.0000 mg | Freq: Once | ORAL | Status: DC | PRN
  Filled 2018-10-14: qty 5

## 2018-10-14 MED ORDER — KETOROLAC TROMETHAMINE 30 MG/ML IJ SOLN
INTRAMUSCULAR | Status: DC | PRN
  Administered 2018-10-14: 30 mg via INTRAVENOUS

## 2018-10-14 MED ORDER — PANTOPRAZOLE SODIUM 40 MG PO TBEC
DELAYED_RELEASE_TABLET | ORAL | Status: AC
  Filled 2018-10-14: qty 1

## 2018-10-14 MED ORDER — CLINDAMYCIN PHOSPHATE 900 MG/50ML IV SOLN
900.0000 mg | INTRAVENOUS | Status: AC
  Administered 2018-10-14: 900 mg via INTRAVENOUS
  Filled 2018-10-14: qty 50

## 2018-10-14 MED ORDER — FENTANYL CITRATE (PF) 100 MCG/2ML IJ SOLN
INTRAMUSCULAR | Status: DC | PRN
  Administered 2018-10-14 (×3): 50 ug via INTRAVENOUS
  Administered 2018-10-14: 100 ug via INTRAVENOUS

## 2018-10-14 MED ORDER — ONDANSETRON HCL 4 MG/2ML IJ SOLN
4.0000 mg | Freq: Four times a day (QID) | INTRAMUSCULAR | Status: DC | PRN
  Filled 2018-10-14: qty 2

## 2018-10-14 MED ORDER — SIMETHICONE 80 MG PO CHEW
CHEWABLE_TABLET | ORAL | Status: AC
  Filled 2018-10-14: qty 1

## 2018-10-14 MED ORDER — PROPOFOL 10 MG/ML IV BOLUS
INTRAVENOUS | Status: DC | PRN
  Administered 2018-10-14: 150 mg via INTRAVENOUS

## 2018-10-14 MED ORDER — SCOPOLAMINE 1 MG/3DAYS TD PT72
MEDICATED_PATCH | TRANSDERMAL | Status: DC | PRN
  Administered 2018-10-14: 1 via TRANSDERMAL

## 2018-10-14 MED ORDER — SIMETHICONE 80 MG PO CHEW
80.0000 mg | CHEWABLE_TABLET | Freq: Four times a day (QID) | ORAL | Status: DC | PRN
  Administered 2018-10-14: 80 mg via ORAL
  Filled 2018-10-14: qty 1

## 2018-10-14 MED ORDER — LIDOCAINE 2% (20 MG/ML) 5 ML SYRINGE
INTRAMUSCULAR | Status: DC | PRN
  Administered 2018-10-14: 60 mg via INTRAVENOUS

## 2018-10-14 MED ORDER — OXYCODONE-ACETAMINOPHEN 5-325 MG PO TABS
ORAL_TABLET | ORAL | Status: AC
  Filled 2018-10-14: qty 2

## 2018-10-14 MED ORDER — ARTIFICIAL TEARS OPHTHALMIC OINT
TOPICAL_OINTMENT | OPHTHALMIC | Status: AC
  Filled 2018-10-14: qty 3.5

## 2018-10-14 MED ORDER — LACTATED RINGERS IV SOLN
INTRAVENOUS | Status: DC
  Administered 2018-10-14: 11:00:00 via INTRAVENOUS
  Filled 2018-10-14: qty 1000

## 2018-10-14 MED ORDER — LIDOCAINE 2% (20 MG/ML) 5 ML SYRINGE
INTRAMUSCULAR | Status: AC
  Filled 2018-10-14: qty 5

## 2018-10-14 MED ORDER — DEXAMETHASONE SODIUM PHOSPHATE 10 MG/ML IJ SOLN
INTRAMUSCULAR | Status: AC
  Filled 2018-10-14: qty 1

## 2018-10-14 MED ORDER — PROPOFOL 10 MG/ML IV BOLUS
INTRAVENOUS | Status: AC
  Filled 2018-10-14: qty 20

## 2018-10-14 MED ORDER — ACETAMINOPHEN 500 MG PO TABS
ORAL_TABLET | ORAL | Status: AC
  Filled 2018-10-14: qty 2

## 2018-10-14 MED ORDER — HYDROMORPHONE HCL 2 MG PO TABS
2.0000 mg | ORAL_TABLET | ORAL | Status: DC | PRN
  Administered 2018-10-14 (×2): 2 mg via ORAL
  Filled 2018-10-14: qty 1

## 2018-10-14 MED ORDER — DEXTROSE IN LACTATED RINGERS 5 % IV SOLN
INTRAVENOUS | Status: DC
  Administered 2018-10-14 (×2): via INTRAVENOUS
  Filled 2018-10-14 (×3): qty 1000

## 2018-10-14 MED ORDER — MENTHOL 3 MG MT LOZG
1.0000 | LOZENGE | OROMUCOSAL | Status: DC | PRN
  Administered 2018-10-14 – 2018-10-15 (×2): 3 mg via ORAL
  Filled 2018-10-14: qty 9

## 2018-10-14 MED ORDER — GENTAMICIN SULFATE 40 MG/ML IJ SOLN
5.0000 mg/kg | INTRAVENOUS | Status: DC
  Filled 2018-10-14: qty 10

## 2018-10-14 MED ORDER — HYDROMORPHONE HCL 1 MG/ML IJ SOLN
0.2500 mg | INTRAMUSCULAR | Status: DC | PRN
  Administered 2018-10-14 (×2): 0.5 mg via INTRAVENOUS
  Filled 2018-10-14: qty 0.5

## 2018-10-14 MED ORDER — MENTHOL 3 MG MT LOZG
LOZENGE | OROMUCOSAL | Status: AC
  Filled 2018-10-14: qty 9

## 2018-10-14 MED ORDER — ROCURONIUM BROMIDE 10 MG/ML (PF) SYRINGE
PREFILLED_SYRINGE | INTRAVENOUS | Status: DC | PRN
  Administered 2018-10-14: 50 mg via INTRAVENOUS

## 2018-10-14 MED ORDER — OXYCODONE HCL 5 MG PO TABS
5.0000 mg | ORAL_TABLET | Freq: Once | ORAL | Status: DC | PRN
  Filled 2018-10-14: qty 1

## 2018-10-14 MED ORDER — CLINDAMYCIN PHOSPHATE 900 MG/50ML IV SOLN
INTRAVENOUS | Status: AC
  Filled 2018-10-14: qty 50

## 2018-10-14 SURGICAL SUPPLY — 46 items
ADH SKN CLS APL DERMABOND .7 (GAUZE/BANDAGES/DRESSINGS) ×1
BAG URINE DRAINAGE (UROLOGICAL SUPPLIES) ×3 IMPLANT
CATH FOLEY 2WAY SLVR  5CC 16FR (CATHETERS) ×2
CATH FOLEY 2WAY SLVR 5CC 16FR (CATHETERS) ×1 IMPLANT
CATH ROBINSON RED A/P 16FR (CATHETERS) ×3 IMPLANT
CLOSURE WOUND 1/4 X3 (GAUZE/BANDAGES/DRESSINGS)
COVER BACK TABLE 60X90IN (DRAPES) ×6 IMPLANT
COVER MAYO STAND STRL (DRAPES) ×6 IMPLANT
COVER WAND RF STERILE (DRAPES) ×3 IMPLANT
DECANTER SPIKE VIAL GLASS SM (MISCELLANEOUS) ×2 IMPLANT
DERMABOND ADVANCED (GAUZE/BANDAGES/DRESSINGS) ×2
DERMABOND ADVANCED .7 DNX12 (GAUZE/BANDAGES/DRESSINGS) ×1 IMPLANT
DRSG OPSITE POSTOP 3X4 (GAUZE/BANDAGES/DRESSINGS) ×3 IMPLANT
ELECT REM PT RETURN 9FT ADLT (ELECTROSURGICAL) ×3
ELECTRODE REM PT RTRN 9FT ADLT (ELECTROSURGICAL) ×1 IMPLANT
FORCEPS CUTTING 33CM 5MM (CUTTING FORCEPS) IMPLANT
GAUZE 4X4 16PLY RFD (DISPOSABLE) ×3 IMPLANT
GLOVE BIOGEL PI IND STRL 7.0 (GLOVE) ×2 IMPLANT
GLOVE BIOGEL PI INDICATOR 7.0 (GLOVE) ×4
GLOVE ECLIPSE 7.0 STRL STRAW (GLOVE) ×12 IMPLANT
HOLDER FOLEY CATH W/STRAP (MISCELLANEOUS) ×3 IMPLANT
KIT TURNOVER CYSTO (KITS) ×3 IMPLANT
LIGASURE VESSEL 5MM BLUNT TIP (ELECTROSURGICAL) ×2 IMPLANT
NS IRRIG 500ML POUR BTL (IV SOLUTION) ×3 IMPLANT
PACK LAVH (CUSTOM PROCEDURE TRAY) ×3 IMPLANT
PACK TRENDGUARD 450 HYBRID PRO (MISCELLANEOUS) IMPLANT
PAD OB MATERNITY 4.3X12.25 (PERSONAL CARE ITEMS) ×3 IMPLANT
SCISSORS LAP 5X35 DISP (ENDOMECHANICALS) IMPLANT
SET IRRIG TUBING LAPAROSCOPIC (IRRIGATION / IRRIGATOR) ×3 IMPLANT
STRIP CLOSURE SKIN 1/4X3 (GAUZE/BANDAGES/DRESSINGS) IMPLANT
SUT MNCRL 0 MO-4 VIOLET 18 CR (SUTURE) ×2 IMPLANT
SUT MNCRL 0 VIOLET 6X18 (SUTURE) ×1 IMPLANT
SUT MONOCRYL 0 6X18 (SUTURE) ×2
SUT MONOCRYL 0 MO 4 18  CR/8 (SUTURE) ×4
SUT VIC AB 2-0 CT1 27 (SUTURE) ×9
SUT VIC AB 2-0 CT1 TAPERPNT 27 (SUTURE) ×3 IMPLANT
SUT VICRYL 0 UR6 27IN ABS (SUTURE) ×6 IMPLANT
SUT VICRYL RAPIDE 3 0 (SUTURE) ×3 IMPLANT
SYR BULB IRRIGATION 50ML (SYRINGE) IMPLANT
TOWEL OR 17X26 10 PK STRL BLUE (TOWEL DISPOSABLE) ×6 IMPLANT
TRENDGUARD 450 HYBRID PRO PACK (MISCELLANEOUS) ×3
TROCAR BALLN 12MMX100 BLUNT (TROCAR) ×3 IMPLANT
TROCAR XCEL NON-BLD 5MMX100MML (ENDOMECHANICALS) ×6 IMPLANT
TUBING EVAC SMOKE HEATED PNEUM (TUBING) ×3 IMPLANT
WARMER LAPAROSCOPE (MISCELLANEOUS) ×3 IMPLANT
WATER STERILE IRR 500ML POUR (IV SOLUTION) ×3 IMPLANT

## 2018-10-14 NOTE — Transfer of Care (Signed)
Immediate Anesthesia Transfer of Care Note  Patient: Brandi Sutton  Procedure(s) Performed: LAPAROSCOPIC ASSISTED VAGINAL HYSTERECTOMY WITH SALPINGECTOMY (Bilateral Abdomen)  Patient Location: PACU  Anesthesia Type:General  Level of Consciousness: awake, alert , oriented and patient cooperative  Airway & Oxygen Therapy: Patient Spontanous Breathing and Patient connected to nasal cannula oxygen  Post-op Assessment: Report given to RN and Post -op Vital signs reviewed and stable  Post vital signs: Reviewed and stable  Last Vitals:  Vitals Value Taken Time  BP    Temp    Pulse 88 10/14/2018  1:30 PM  Resp    SpO2 100 % 10/14/2018  1:30 PM  Vitals shown include unvalidated device data.  Last Pain:  Vitals:   10/14/18 1005  TempSrc: Oral         Complications: No apparent anesthesia complications

## 2018-10-14 NOTE — H&P (Signed)
Brandi Sutton is an 43 y.o. white  female who presents to the OR for a LAVH bilateral salpingectomy secondary to chronic pelvic pain. She believes that this pain began after her endometrial ablation. She has had a GI work up and a nl GYN u/s. The etiology of the pain is unclear however given the time line she may have a hematometria.   Past Medical History:  Diagnosis Date  . Allergy   . ASTHMA 03/31/2007   no attack in years per patient   . Asthma    allergy related  . Breast mass, left   . CONTACT DERMATITIS&OTHER ECZEMA DUE TO PLANTS 12/03/2009  . Depression   . DYSFUNCTIONAL UTERINE BLEEDING 01/24/2010  . GERD (gastroesophageal reflux disease)   . Headache    sinus headaches   . HIP PAIN, RIGHT 01/22/2009  . LOW BACK PAIN, ACUTE 04/17/2008    Past Surgical History:  Procedure Laterality Date  . BREAST EXCISIONAL BIOPSY Left 2017  . CHOLECYSTECTOMY N/A 12/25/2015   Procedure: LAPAROSCOPIC CHOLECYSTECTOMY;  Surgeon: Rolm Bookbinder, MD;  Location: [facility];  Service: General;  Laterality: N/A;  . RADIOACTIVE SEED GUIDED EXCISIONAL BREAST BIOPSY Left 07/14/2016   Procedure: RADIOACTIVE SEED GUIDED EXCISIONAL BREAST BIOPSY;  Surgeon: Rolm Bookbinder, MD;  Location: [facility];  Service: General;  Laterality: Left;  . TONSILLECTOMY AND ADENOIDECTOMY    . uterine abalation    . WISDOM TOOTH EXTRACTION      Family History  Problem Relation Age of Onset  . Depression Mother   . Diabetes Mother        diet controlled  . Transient ischemic attack Mother   . Colon polyps Mother        pre-cancerous  . Hypertension Father   . Diabetes Father        diet controlled  . Bowel Disease Father        blockage, had to wear a bag for awhile  . Lung cancer Paternal Grandfather   . Stroke Maternal Grandfather    Social History:  reports that she quit smoking about 12 years ago. Her smoking use included cigarettes. She has never used smokeless tobacco. She reports current  alcohol use. She reports that she does not use drugs.  Allergies:  Allergies  Allergen Reactions  . Penicillins Hives and Itching    Has patient had a PCN reaction causing immediate rash, facial/tongue/throat swelling, SOB or lightheadedness with hypotension: no Has patient had a PCN reaction causing severe rash involving mucus membranes or skin necrosis: no Has patient had a PCN reaction that required hospitalization no Has patient had a PCN reaction occurring within the last 10 years: yes If all of the above answers are "NO", then may proceed with Cephalosporin use.   . Bupropion Hcl Hives and Rash  . Sulfa Antibiotics Rash    Medications Prior to Admission  Medication Sig Dispense Refill  . albuterol (PROVENTIL HFA;VENTOLIN HFA) 108 (90 Base) MCG/ACT inhaler Inhale 2 puffs into the lungs every 6 (six) hours as needed for wheezing or shortness of breath.    . fluticasone (FLONASE) 50 MCG/ACT nasal spray Place 1 spray into both nostrils daily. (Patient taking differently: Place 1 spray into both nostrils daily as needed for allergies. ) 16 g 0  . ibuprofen (ADVIL,MOTRIN) 200 MG tablet Take 600 mg by mouth every 6 (six) hours as needed for moderate pain.    . Melatonin 2.5 MG CHEW Chew 2 each by mouth at bedtime. As  needed- gummies for sleep    . Multiple Vitamin (MULTIVITAMIN WITH MINERALS) TABS tablet Take 1 tablet by mouth daily.    Marland Kitchen omeprazole (PRILOSEC) 20 MG capsule Take 20 mg by mouth daily.    . polycarbophil (FIBERCON) 625 MG tablet Take 625 mg by mouth daily.    . cholestyramine light (PREVALITE) 4 g packet Take 1 packet (4 g total) by mouth 2 (two) times daily. (Patient not taking: Reported on 09/29/2018) 60 packet 11  . lansoprazole (PREVACID) 15 MG capsule Take 15 mg by mouth daily.          Blood pressure 130/82, pulse 64, temperature (!) 97.4 F (36.3 C), temperature source Oral, resp. rate 16, weight 80.8 kg, SpO2 100 %. General appearance: alert and  cooperative Abdomen: soft, non-tender; bowel sounds normal; no masses,  no organomegaly   Lab Results  Component Value Date   WBC 7.1 10/08/2018   HGB 13.9 10/08/2018   HCT 41.9 10/08/2018   MCV 93.5 10/08/2018   PLT 246 10/08/2018   Lab Results  Component Value Date   PREGTESTUR NEGATIVE 10/14/2018   PREGSERUM NEGATIVE 12/21/2015      Patient Active Problem List   Diagnosis Date Noted  . Abdominal pain 06/15/2018  . Routine general medical examination at a health care facility 06/15/2018  . Allergic rhinitis 07/19/2013  . DYSFUNCTIONAL UTERINE BLEEDING 01/24/2010  . CONTACT DERMATITIS&OTHER ECZEMA DUE TO PLANTS 12/03/2009   Imp/ Chronic pelvic pain, possible hematometria Plan/LAVH with bilateral salpingectomy  Hiroko Tregre E 10/14/2018, 10:24 AM

## 2018-10-14 NOTE — Anesthesia Preprocedure Evaluation (Addendum)
Anesthesia Evaluation  Patient identified by MRN, date of birth, ID band Patient awake    Reviewed: Allergy & Precautions, NPO status , Patient's Chart, lab work & pertinent test results  Airway Mallampati: II  TM Distance: >3 FB Neck ROM: Full    Dental no notable dental hx. (+) Teeth Intact, Dental Advisory Given   Pulmonary neg pulmonary ROS, former smoker,    Pulmonary exam normal breath sounds clear to auscultation       Cardiovascular negative cardio ROS Normal cardiovascular exam Rhythm:Regular Rate:Normal     Neuro/Psych Depression negative neurological ROS     GI/Hepatic Neg liver ROS, GERD  ,  Endo/Other  negative endocrine ROS  Renal/GU negative Renal ROS  negative genitourinary   Musculoskeletal negative musculoskeletal ROS (+)   Abdominal   Peds negative pediatric ROS (+)  Hematology negative hematology ROS (+)   Anesthesia Other Findings   Reproductive/Obstetrics negative OB ROS                            Anesthesia Physical Anesthesia Plan  ASA: II  Anesthesia Plan: General   Post-op Pain Management:    Induction: Intravenous  PONV Risk Score and Plan: 4 or greater and Ondansetron, Dexamethasone, Treatment may vary due to age or medical condition, Midazolam and Scopolamine patch - Pre-op  Airway Management Planned: Oral ETT  Additional Equipment:   Intra-op Plan:   Post-operative Plan: Extubation in OR  Informed Consent: I have reviewed the patients History and Physical, chart, labs and discussed the procedure including the risks, benefits and alternatives for the proposed anesthesia with the patient or authorized representative who has indicated his/her understanding and acceptance.     Dental advisory given  Plan Discussed with: CRNA and Surgeon  Anesthesia Plan Comments:         Anesthesia Quick Evaluation

## 2018-10-14 NOTE — Anesthesia Procedure Notes (Signed)
Procedure Name: Intubation Date/Time: 10/14/2018 12:02 PM Performed by: Wanita Chamberlain, CRNA Pre-anesthesia Checklist: Timeout performed, Patient being monitored, Suction available, Emergency Drugs available and Patient identified Patient Re-evaluated:Patient Re-evaluated prior to induction Oxygen Delivery Method: Circle system utilized Preoxygenation: Pre-oxygenation with 100% oxygen Induction Type: IV induction Ventilation: Mask ventilation without difficulty Laryngoscope Size: Mac and 3 Grade View: Grade I Tube type: Oral Number of attempts: 1 Airway Equipment and Method: Stylet Placement Confirmation: breath sounds checked- equal and bilateral,  CO2 detector,  positive ETCO2 and ETT inserted through vocal cords under direct vision Secured at: 21 cm Tube secured with: Tape Dental Injury: Teeth and Oropharynx as per pre-operative assessment

## 2018-10-14 NOTE — Anesthesia Postprocedure Evaluation (Signed)
Anesthesia Post Note  Patient: Brandi Sutton  Procedure(s) Performed: LAPAROSCOPIC ASSISTED VAGINAL HYSTERECTOMY WITH SALPINGECTOMY (Bilateral Abdomen)     Patient location during evaluation: PACU Anesthesia Type: General Level of consciousness: awake and alert and oriented Pain management: pain level controlled Vital Signs Assessment: post-procedure vital signs reviewed and stable Respiratory status: spontaneous breathing, nonlabored ventilation and respiratory function stable Cardiovascular status: blood pressure returned to baseline and stable Postop Assessment: no apparent nausea or vomiting Anesthetic complications: no    Last Vitals:  Vitals:   10/14/18 1430 10/14/18 1433  BP: 113/75   Pulse: 67 62  Resp: 16 15  Temp:    SpO2: 100% 99%    Last Pain:  Vitals:   10/14/18 1419  TempSrc: Oral                 Jaelen Soth A.

## 2018-10-15 ENCOUNTER — Encounter (HOSPITAL_BASED_OUTPATIENT_CLINIC_OR_DEPARTMENT_OTHER): Payer: Self-pay | Admitting: Obstetrics and Gynecology

## 2018-10-15 DIAGNOSIS — N888 Other specified noninflammatory disorders of cervix uteri: Secondary | ICD-10-CM | POA: Diagnosis not present

## 2018-10-15 LAB — CBC
HCT: 37.4 % (ref 36.0–46.0)
Hemoglobin: 12.1 g/dL (ref 12.0–15.0)
MCH: 30.9 pg (ref 26.0–34.0)
MCHC: 32.4 g/dL (ref 30.0–36.0)
MCV: 95.4 fL (ref 80.0–100.0)
Platelets: 234 10*3/uL (ref 150–400)
RBC: 3.92 MIL/uL (ref 3.87–5.11)
RDW: 12.2 % (ref 11.5–15.5)
WBC: 12 10*3/uL — ABNORMAL HIGH (ref 4.0–10.5)
nRBC: 0 % (ref 0.0–0.2)

## 2018-10-15 MED ORDER — OXYCODONE-ACETAMINOPHEN 5-325 MG PO TABS
ORAL_TABLET | ORAL | Status: AC
  Filled 2018-10-15: qty 2

## 2018-10-15 NOTE — Progress Notes (Signed)
POD#1 Pt doing well this am. Ambulating/voiding/tolerating diet/adequate pain control HGB-12 VSSAF IMP/ Stable          Ready for discharge, husband will be helping Plan/Discharge

## 2018-10-15 NOTE — Progress Notes (Signed)
No pain medication/Rx noted on D/C instructions, Dr. Ouida Sills notified, advised Rx was called in to patient's pharmacy. AVS updated. Lyndel Pleasure RN.

## 2018-10-15 NOTE — Discharge Instructions (Signed)
Percocet 5/325 1-2 tablets every 4 to 6 hours as needed for pain per Dr. Ouida Sills, take starting at 1:30 pm today

## 2018-10-17 NOTE — Discharge Summary (Signed)
Pt was admitted for a LAVH bilateral salpingectomy  Secondary to chronic pelvic pain. She underwent this procedure with complications. Please see dictated op note. The pathology was benign. She had a benign post op course. She was discharged on POD#1. At the time of discharge she was eating a nl diet/ambulating/tolerating post op pain. She was discharged to home with percocet.Her vitals were wnl.She had no vaginal bleeding.

## 2018-10-18 NOTE — Op Note (Signed)
NAMEMCKYNZI, CAMMON MEDICAL RECORD SJ:[id number] ACCOUNT [id number] DATE OF BIRTH:1976/06/10 FACILITY: [facility] LOCATION: [facility] PHYSICIAN:Dammon Makarewicz Kathline Magic, MD  OPERATIVE REPORT  DATE OF PROCEDURE:  10/14/2018  PREOPERATIVE DIAGNOSES:   1.  Chronic pelvic pain. 2.  Suspected adenomyosis.  POSTOPERATIVE DIAGNOSES: 1.  Chronic pelvic pain. 2.  Suspected adenomyosis.  PROCEDURE:   1.  Laparoscopic assisted vaginal hysterectomy 2.  Bilateral salpingectomy.  SURGEON:  Jestina Stephani E. Ouida Sills, MD   ASSISTANT:  Debbora Dus, MD  ANESTHESIA:  General and local.  ANTIBIOTICS:  Clindamycin and gentamicin.    ESTIMATED BLOOD LOSS:  150 mL.  COMPLICATIONS:  None.  SPECIMENS:  Cervix, uterus, fallopian tube and sent to pathology.  DESCRIPTION OF PROCEDURE:  The patient was taken to the operating room where she was placed in the dorsal supine position.  A general anesthetic was administered without difficulty.  She was then prepped and draped in the usual fashion for this  procedure.  Her bladder was drained with a red rubber catheter.  Hulka tenaculum was applied to the anterior cervical lip.  Her umbilicus was injected with 0.25% Marcaine and then the vertical skin incision was made.  The fascia was grasped with the  Kochers and entered with the Mayo scissors.  Parietal peritoneum was entered bluntly 0 Vicryl suture was placed in a pursestring fashion.  The Hasson cannula was placed in the abdominal cavity.  Three liters of carbon dioxide was insufflated.  The  patient was then placed in Trendelenburg.  The scope was placed in the abdominal cavity.  Five mm ports were placed in the right and left lower quadrants under direct visualization.  On examination of the pelvis, there was no evidence of any adhesions or  endometriosis.  At this point, the left fallopian tube was grasped and the mesosalpinx cauterized and transected.  The ovarian ligament was then cauterized and transected.  The  round ligament was then cauterized and transected.  The broad ligament was  then opened and transected and cauterized down to the level of the uterine artery.  A similar procedure was performed on the opposite side.  The ureters appeared to be out of the operative field.  This concluded the laparoscopic portion of the procedure.     Attention was then turned to the vagina.  The patient was repositioned.  A weighted speculum was placed in the vagina.  The cervix was injected with 1% lidocaine with epinephrine.  Posterior cul-de-sac was entered sharply.  The uterosacral ligaments were  bilaterally clamped, cut and ligated with 0 Monocryl suture.  The cervix was then circumscribed.  The bladder pillars were bilaterally clamped, cut and ligated with 0 Monocryl suture.  The anterior cul-de-sac was entered sharply.  The cardinal ligaments  were serially clamped, cut and ligated with 0 Monocryl suture.  The uterine artery was bilaterally clamped, cut and ligated with 0 Monocryl suture.  The specimen was then removed.  The pedicles were all checked and felt to be hemostatic.  The posterior  cuff was then closed using 2-0 Vicryl in a running locking fashion.  Uterosacral ligaments were approximated in the midline using 2-0 Monocryl suture.  The remaining vaginal cuff was closed using 2-0 Vicryl suture in a vertical fashion.  The cuff was  checked for hemostasis and felt to be dry.  Attention was then turned to the umbilicus where the abdomen was reinsufflated.  All pedicles were irrigated and checked for hemostasis.  Once this was completed, the pneumoperitoneum was released.  The trocars  were removed and the umbilical incision closed with 0 Vicryl suture in a pursestring fashion.  The skin incisions were closed with Dermabond.  The patient was extubated and taken to recovery room in stable condition.  Instrument, lap count correct x3.  JN/NUANCE  D:10/17/2018 T:10/18/2018 JOB:[id number]

## 2018-12-03 ENCOUNTER — Encounter (HOSPITAL_BASED_OUTPATIENT_CLINIC_OR_DEPARTMENT_OTHER): Payer: Self-pay | Admitting: Obstetrics and Gynecology

## 2019-10-24 ENCOUNTER — Telehealth: Payer: Self-pay

## 2019-10-24 NOTE — Telephone Encounter (Signed)
Pt message sent to Dr Volanda Napoleon for advise

## 2019-11-01 NOTE — Telephone Encounter (Signed)
Ok for TOC.

## 2019-11-01 NOTE — Telephone Encounter (Signed)
Spoke with pt scheduled a TOC with Dr Volanda Napoleon on 12/02/2019 at 8.30 pm

## 2019-11-30 ENCOUNTER — Ambulatory Visit: Payer: 59 | Admitting: Family Medicine

## 2020-01-02 ENCOUNTER — Encounter: Payer: Self-pay | Admitting: Family Medicine

## 2020-01-02 ENCOUNTER — Ambulatory Visit (INDEPENDENT_AMBULATORY_CARE_PROVIDER_SITE_OTHER): Payer: 59 | Admitting: Family Medicine

## 2020-01-02 ENCOUNTER — Other Ambulatory Visit: Payer: Self-pay

## 2020-01-02 VITALS — BP 108/68 | HR 73 | Temp 97.8°F | Ht 66.0 in | Wt 188.0 lb

## 2020-01-02 DIAGNOSIS — K219 Gastro-esophageal reflux disease without esophagitis: Secondary | ICD-10-CM | POA: Diagnosis not present

## 2020-01-02 DIAGNOSIS — J302 Other seasonal allergic rhinitis: Secondary | ICD-10-CM | POA: Insufficient documentation

## 2020-01-02 DIAGNOSIS — Z8709 Personal history of other diseases of the respiratory system: Secondary | ICD-10-CM

## 2020-01-02 DIAGNOSIS — Z13220 Encounter for screening for lipoid disorders: Secondary | ICD-10-CM | POA: Diagnosis not present

## 2020-01-02 DIAGNOSIS — M19019 Primary osteoarthritis, unspecified shoulder: Secondary | ICD-10-CM

## 2020-01-02 DIAGNOSIS — Z0000 Encounter for general adult medical examination without abnormal findings: Secondary | ICD-10-CM | POA: Diagnosis not present

## 2020-01-02 LAB — LIPID PANEL
Cholesterol: 181 mg/dL (ref 0–200)
HDL: 55.4 mg/dL (ref >39.00)
LDL Cholesterol: 109 mg/dL — ABNORMAL HIGH (ref 0–99)
NonHDL: 125.24
Total CHOL/HDL Ratio: 3
Triglycerides: 79 mg/dL (ref 0.0–149.0)
VLDL: 15.8 mg/dL (ref 0.0–40.0)

## 2020-01-02 LAB — CBC WITH DIFFERENTIAL/PLATELET
Basophils Absolute: 0.1 10*3/uL (ref 0.0–0.1)
Basophils Relative: 1.3 % (ref 0.0–3.0)
Eosinophils Absolute: 0.2 10*3/uL (ref 0.0–0.7)
Eosinophils Relative: 3.3 % (ref 0.0–5.0)
HCT: 41.5 % (ref 36.0–46.0)
Hemoglobin: 14.1 g/dL (ref 12.0–15.0)
Lymphocytes Relative: 23.8 % (ref 12.0–46.0)
Lymphs Abs: 1.2 10*3/uL (ref 0.7–4.0)
MCHC: 33.9 g/dL (ref 30.0–36.0)
MCV: 91.3 fl (ref 78.0–100.0)
Monocytes Absolute: 0.3 10*3/uL (ref 0.1–1.0)
Monocytes Relative: 7 % (ref 3.0–12.0)
Neutro Abs: 3.2 10*3/uL (ref 1.4–7.7)
Neutrophils Relative %: 64.6 % (ref 43.0–77.0)
Platelets: 247 10*3/uL (ref 150.0–400.0)
RBC: 4.55 Mil/uL (ref 3.87–5.11)
RDW: 12.8 % (ref 11.5–15.5)
WBC: 4.9 10*3/uL (ref 4.0–10.5)

## 2020-01-02 LAB — COMPREHENSIVE METABOLIC PANEL
ALT: 17 U/L (ref 0–35)
AST: 17 U/L (ref 0–37)
Albumin: 4.1 g/dL (ref 3.5–5.2)
Alkaline Phosphatase: 60 U/L (ref 39–117)
BUN: 10 mg/dL (ref 6–23)
CO2: 26 mEq/L (ref 19–32)
Calcium: 8.9 mg/dL (ref 8.4–10.5)
Chloride: 106 mEq/L (ref 96–112)
Creatinine, Ser: 0.82 mg/dL (ref 0.40–1.20)
GFR: 74.92 mL/min (ref >60.00)
Glucose, Bld: 108 mg/dL — ABNORMAL HIGH (ref 70–99)
Potassium: 4.3 mEq/L (ref 3.5–5.1)
Sodium: 138 mEq/L (ref 135–145)
Total Bilirubin: 0.5 mg/dL (ref 0.2–1.2)
Total Protein: 6.4 g/dL (ref 6.0–8.3)

## 2020-01-02 LAB — HEMOGLOBIN A1C: Hgb A1c MFr Bld: 5.3 % (ref 4.6–6.5)

## 2020-01-02 NOTE — Progress Notes (Signed)
Subjective:     Brandi Sutton is a 44 y.o. female and is here for a comprehensive physical exam and TOC, previously seen by Dr. Sherren Mocha. The patient reports problems - arthritis in shoulder. Pt with arthritis, bone spur in right shoulder.  Also status as tearing of muscle which is healing.  Patient planning to have surgery however would like to wait until after the summer.  Followed by Raliegh Ip.  Pt notes history of GERD, not currently on medication.  Avoids foods that trigger symptoms including more than 1 glass of wine and watermelon.  Pt notes h/o asthma.  Not currently using any meds as inhalers are expensive.  Notes a controlled allergy symptoms does not have issues with asthma.  Pt s/p hysterectomy in 2020 2/2 AUB, abdominal pain, and ovarian cyst.  Still has ovaries.  Notes occasional pain 2/2 scar tissue.  H/o cholecystectomy.  Pt denies tobacco, alcohol, drug use.  Currently working at [employer].  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  Last mammogram 2019.  Allergies: Penicillin-hives, pruritus Wellbutrin-hives, pruritus Social History   Socioeconomic History  . Marital status: Married    Spouse name: Not on file  . Number of children: 1  . Years of education: Not on file  . Highest education level: Not on file  Occupational History  . Occupation: Chartered certified accountant  Tobacco Use  . Smoking status: Former Smoker    Types: Cigarettes    Quit date: 12/09/2005    Years since quitting: 14.0  . Smokeless tobacco: Never Used  Substance and Sexual Activity  . Alcohol use: Yes    Alcohol/week: 0.0 standard drinks    Comment: 1 beers per week liquor 1x week   . Drug use: No  . Sexual activity: Yes    Birth control/protection: None  Other Topics Concern  . Not on file  Social History Narrative  . Not on file   Social Determinants of Health   Financial Resource Strain:   . Difficulty of Paying Living  Expenses:   Food Insecurity:   . Worried About Charity fundraiser in the Last Year:   . Arboriculturist in the Last Year:   Transportation Needs:   . Film/video editor (Medical):   Marland Kitchen Lack of Transportation (Non-Medical):   Physical Activity:   . Days of Exercise per Week:   . Minutes of Exercise per Session:   Stress:   . Feeling of Stress :   Social Connections:   . Frequency of Communication with Friends and Family:   . Frequency of Social Gatherings with Friends and Family:   . Attends Religious Services:   . Active Member of Clubs or Organizations:   . Attends Archivist Meetings:   Marland Kitchen Marital Status:   Intimate Partner Violence:   . Fear of Current or Ex-Partner:   . Emotionally Abused:   Marland Kitchen Physically Abused:   . Sexually Abused:    Health Maintenance  Topic Date Due  . COVID-19 Vaccine (1) Never done  . HIV Screening  Never done  . PAP SMEAR-Modifier  05/07/2019  . INFLUENZA VACCINE  03/11/2020  . COLONOSCOPY  06/20/2021  . TETANUS/TDAP  03/04/2027    The following portions of the patient's history were reviewed and updated as appropriate: allergies, current medications, past family history, past medical history, past social history, past surgical history and problem list.  Review of Systems Pertinent  items noted in HPI and remainder of comprehensive ROS otherwise negative.   Objective:    BP 108/68 (BP Location: Left Arm, Patient Position: Sitting, Cuff Size: Large)   Pulse 73   Temp 97.8 F (36.6 C) (Temporal)   Ht 5\' 6"  (1.676 m)   Wt 188 lb (85.3 kg)   SpO2 98%   BMI 30.34 kg/m  General appearance: alert, cooperative and no distress Head: Normocephalic, without obvious abnormality, atraumatic Eyes: conjunctivae/corneas clear. PERRL, EOM's intact. Fundi benign. Ears: normal TM's and external ear canals both ears Nose: Nares normal. Septum midline. Mucosa normal. No drainage or sinus tenderness. Throat: lips, mucosa, and tongue normal;  teeth and gums normal Neck: no adenopathy, no carotid bruit, no JVD, supple, symmetrical, trachea midline and thyroid not enlarged, symmetric, no tenderness/mass/nodules Lungs: clear to auscultation bilaterally Heart: regular rate and rhythm, S1, S2 normal, no murmur, click, rub or gallop Abdomen: soft, non-tender; bowel sounds normal; no masses,  no organomegaly Extremities: extremities normal, atraumatic, no cyanosis or edema Pulses: 2+ and symmetric Skin: Skin color, texture, turgor normal. No rashes or lesions Lymph nodes: Cervical, supraclavicular, and axillary nodes normal. Neurologic: Alert and oriented X 3, normal strength and tone. Normal symmetric reflexes. Normal coordination and gait    Assessment:    Healthy female exam with ongoing R shoulder arthritis and bone spur.     Plan:     Anticipatory guidance given including wearing seatbelts, smoke detectors in the home, increasing physical activity, increasing p.o. intake of water and vegetables. -We will obtain labs -Pt to schedule mammogram. -Pap not indicated s/p hysterectomy 2020 -Given handout -Next CPE in 1 year See After Visit Summary for Counseling Recommendations    Arthritis of shoulder -Also with bone spur -Continue follow-up with Raliegh Ip -Surgery to be scheduled  History of asthma -Controlled -Continue controlling allergy symptoms to prevent attacks  Gastroesophageal reflux disease, unspecified whether esophagitis present - Plan: Comprehensive metabolic panel  Screening for cholesterol level  -Discussed lifestyle modifications - Plan: Lipid panel  Seasonal allergies -Supportive care as needed -Continue Flonase as needed  Follow-up as needed  Grier Mitts, MD

## 2020-01-02 NOTE — Patient Instructions (Addendum)
Preventive Care 40-46 Years Old, Female Preventive care refers to visits with your health care provider and lifestyle choices that can promote health and wellness. This includes:  A yearly physical exam. This may also be called an annual well check.  Regular dental visits and eye exams.  Immunizations.  Screening for certain conditions.  Healthy lifestyle choices, such as eating a healthy diet, getting regular exercise, not using drugs or products that contain nicotine and tobacco, and limiting alcohol use. What can I expect for my preventive care visit? Physical exam Your health care provider will check your:  Height and weight. This may be used to calculate body mass index (BMI), which tells if you are at a healthy weight.  Heart rate and blood pressure.  Skin for abnormal spots. Counseling Your health care provider may ask you questions about your:  Alcohol, tobacco, and drug use.  Emotional well-being.  Home and relationship well-being.  Sexual activity.  Eating habits.  Work and work environment.  Method of birth control.  Menstrual cycle.  Pregnancy history. What immunizations do I need?  Influenza (flu) vaccine  This is recommended every year. Tetanus, diphtheria, and pertussis (Tdap) vaccine  You may need a Td booster every 10 years. Varicella (chickenpox) vaccine  You may need this if you have not been vaccinated. Zoster (shingles) vaccine  You may need this after age 60. Measles, mumps, and rubella (MMR) vaccine  You may need at least one dose of MMR if you were born in 1957 or later. You may also need a second dose. Pneumococcal conjugate (PCV13) vaccine  You may need this if you have certain conditions and were not previously vaccinated. Pneumococcal polysaccharide (PPSV23) vaccine  You may need one or two doses if you smoke cigarettes or if you have certain conditions. Meningococcal conjugate (MenACWY) vaccine  You may need this if you  have certain conditions. Hepatitis A vaccine  You may need this if you have certain conditions or if you travel or work in places where you may be exposed to hepatitis A. Hepatitis B vaccine  You may need this if you have certain conditions or if you travel or work in places where you may be exposed to hepatitis B. Haemophilus influenzae type b (Hib) vaccine  You may need this if you have certain conditions. Human papillomavirus (HPV) vaccine  If recommended by your health care provider, you may need three doses over 6 months. You may receive vaccines as individual doses or as more than one vaccine together in one shot (combination vaccines). Talk with your health care provider about the risks and benefits of combination vaccines. What tests do I need? Blood tests  Lipid and cholesterol levels. These may be checked every 5 years, or more frequently if you are over 50 years old.  Hepatitis C test.  Hepatitis B test. Screening  Lung cancer screening. You may have this screening every year starting at age 55 if you have a 30-pack-year history of smoking and currently smoke or have quit within the past 15 years.  Colorectal cancer screening. All adults should have this screening starting at age 50 and continuing until age 75. Your health care provider may recommend screening at age 45 if you are at increased risk. You will have tests every 1-10 years, depending on your results and the type of screening test.  Diabetes screening. This is done by checking your blood sugar (glucose) after you have not eaten for a while (fasting). You may have this   done every 1-3 years.  Mammogram. This may be done every 1-2 years. Talk with your health care provider about when you should start having regular mammograms. This may depend on whether you have a family history of breast cancer.  BRCA-related cancer screening. This may be done if you have a family history of breast, ovarian, tubal, or peritoneal  cancers.  Pelvic exam and Pap test. This may be done every 3 years starting at age 46. Starting at age 86, this may be done every 5 years if you have a Pap test in combination with an HPV test. Other tests  Sexually transmitted disease (STD) testing.  Bone density scan. This is done to screen for osteoporosis. You may have this scan if you are at high risk for osteoporosis. Follow these instructions at home: Eating and drinking  Eat a diet that includes fresh fruits and vegetables, whole grains, lean protein, and low-fat dairy.  Take vitamin and mineral supplements as recommended by your health care provider.  Do not drink alcohol if: ? Your health care provider tells you not to drink. ? You are pregnant, may be pregnant, or are planning to become pregnant.  If you drink alcohol: ? Limit how much you have to 0-1 drink a day. ? Be aware of how much alcohol is in your drink. In the [place] one drink equals one 12 oz bottle of beer (355 mL), one 5 oz glass of wine (148 mL), or one 1 oz glass of hard liquor (44 mL). Lifestyle  Take daily care of your teeth and gums.  Stay active. Exercise for at least 30 minutes on 5 or more days each week.  Do not use any products that contain nicotine or tobacco, such as cigarettes, e-cigarettes, and chewing tobacco. If you need help quitting, ask your health care provider.  If you are sexually active, practice safe sex. Use a condom or other form of birth control (contraception) in order to prevent pregnancy and STIs (sexually transmitted infections).  If told by your health care provider, take low-dose aspirin daily starting at age 55. What's next?  Visit your health care provider once a year for a well check visit.  Ask your health care provider how often you should have your eyes and teeth checked.  Stay up to date on all vaccines. This information is not intended to replace advice given to you by your health care provider. Make sure you  discuss any questions you have with your health care provider. Document Revised: 04/08/2018 Document Reviewed: 04/08/2018 Elsevier Patient Education  [place] is a term that is commonly used to refer to joint pain or joint disease. There are more than 100 types of arthritis. What are the causes? The most common cause of this condition is wear and tear of a joint. Other causes include:  Gout.  Inflammation of a joint.  An infection of a joint.  Sprains and other injuries near the joint.  A reaction to medicines or drugs, or an allergic reaction. In some cases, the cause may not be known. What are the signs or symptoms? The main symptom of this condition is pain in the joint during movement. Other symptoms include:  Redness, swelling, or stiffness at a joint.  Warmth coming from the joint.  Fever.  Overall feeling of illness. How is this diagnosed? This condition may be diagnosed with a physical exam and tests, including:  Blood tests.  Urine tests.  Imaging tests, such as  X-rays, an MRI, or a CT scan. Sometimes, fluid is removed from a joint for testing. How is this treated? This condition may be treated with:  Treatment of the cause, if it is known.  Rest.  Raising (elevating) the joint.  Applying cold or hot packs to the joint.  Medicines to improve symptoms and reduce inflammation.  Injections of a steroid such as cortisone into the joint to help reduce pain and inflammation. Depending on the cause of your arthritis, you may need to make lifestyle changes to reduce stress on your joint. Changes may include:  Exercising more.  Losing weight. Follow these instructions at home: Medicines  Take over-the-counter and prescription medicines only as told by your health care provider.  Do not take aspirin to relieve pain if your health care provider thinks that gout may be causing your pain. Activity  Rest your joint if told by  your health care provider. Rest is important when your disease is active and your joint feels painful, swollen, or stiff.  Avoid activities that make the pain worse. It is important to balance activity with rest.  Exercise your joint regularly with range-of-motion exercises as told by your health care provider. Try doing low-impact exercise, such as: ? Swimming. ? Water aerobics. ? Biking. ? Walking. Managing pain, stiffness, and swelling      If directed, put ice on the joint. ? Put ice in a plastic bag. ? Place a towel between your skin and the bag. ? Leave the ice on for 20 minutes, 2-3 times per day.  If your joint is swollen, raise (elevate) it above the level of your heart if directed by your health care provider.  If your joint feels stiff in the morning, try taking a warm shower.  If directed, apply heat to the affected area as often as told by your health care provider. Use the heat source that your health care provider recommends, such as a moist heat pack or a heating pad. If you have diabetes, do not apply heat without permission from your health care provider. To apply heat: ? Place a towel between your skin and the heat source. ? Leave the heat on for 20-30 minutes. ? Remove the heat if your skin turns bright red. This is especially important if you are unable to feel pain, heat, or cold. You may have a greater risk of getting burned. General instructions  Do not use any products that contain nicotine or tobacco, such as cigarettes, e-cigarettes, and chewing tobacco. If you need help quitting, ask your health care provider.  Keep all follow-up visits as told by your health care provider. This is important. Contact a health care provider if:  The pain gets worse.  You have a fever. Get help right away if:  You develop severe joint pain, swelling, or redness.  Many joints become painful and swollen.  You develop severe back pain.  You develop severe weakness  in your leg.  You cannot control your bladder or bowels. Summary  Arthritis is a term that is commonly used to refer to joint pain or joint disease. There are more than 100 types of arthritis.  The most common cause of this condition is wear and tear of a joint. Other causes include gout, inflammation or infection of the joint, sprains, or allergies.  Symptoms of this condition include redness, swelling, or stiffness of the joint. Other symptoms include warmth, fever, or feeling ill.  This condition is treated with rest, elevation, medicines,  and applying cold or hot packs.  Follow your health care provider's instructions about medicines, activity, exercises, and other home care treatments. This information is not intended to replace advice given to you by your health care provider. Make sure you discuss any questions you have with your health care provider. Document Revised: 07/05/2018 Document Reviewed: 07/05/2018 Elsevier Patient Education  2020 [place] for Gastroesophageal Reflux Disease, Adult When you have gastroesophageal reflux disease (GERD), the foods you eat and your eating habits are very important. Choosing the right foods can help ease the discomfort of GERD. Consider working with a diet and nutrition specialist (dietitian) to help you make healthy food choices. What general guidelines should I follow?  Eating plan  Choose healthy foods low in fat, such as fruits, vegetables, whole grains, low-fat dairy products, and lean meat, fish, and poultry.  Eat frequent, small meals instead of three large meals each day. Eat your meals slowly, in a relaxed setting. Avoid bending over or lying down until 2-3 hours after eating.  Limit high-fat foods such as fatty meats or fried foods.  Limit your intake of oils, butter, and shortening to less than 8 teaspoons each day.  Avoid the following: ? Foods that cause symptoms. These may be different for different  people. Keep a food diary to keep track of foods that cause symptoms. ? Alcohol. ? Drinking large amounts of liquid with meals. ? Eating meals during the 2-3 hours before bed.  Cook foods using methods other than frying. This may include baking, grilling, or broiling. Lifestyle  Maintain a healthy weight. Ask your health care provider what weight is healthy for you. If you need to lose weight, work with your health care provider to do so safely.  Exercise for at least 30 minutes on 5 or more days each week, or as told by your health care provider.  Avoid wearing clothes that fit tightly around your waist and chest.  Do not use any products that contain nicotine or tobacco, such as cigarettes and e-cigarettes. If you need help quitting, ask your health care provider.  Sleep with the head of your bed raised. Use a wedge under the mattress or blocks under the bed frame to raise the head of the bed. What foods are not recommended? The items listed may not be a complete list. Talk with your dietitian about what dietary choices are best for you. Grains Pastries or quick breads with added fat. [place] toast. Vegetables Deep fried vegetables. [place] fries. Any vegetables prepared with added fat. Any vegetables that cause symptoms. For some people this may include tomatoes and tomato products, chili peppers, onions and garlic, and horseradish. Fruits Any fruits prepared with added fat. Any fruits that cause symptoms. For some people this may include citrus fruits, such as oranges, grapefruit, pineapple, and lemons. Meats and other protein foods High-fat meats, such as fatty beef or pork, hot dogs, ribs, ham, sausage, salami and bacon. Fried meat or protein, including fried fish and fried chicken. Nuts and nut butters. Dairy Whole milk and chocolate milk. Sour cream. Cream. Ice cream. Cream cheese. Milk shakes. Beverages Coffee and tea, with or without caffeine. Carbonated beverages. Sodas. Energy  drinks. Fruit juice made with acidic fruits (such as orange or grapefruit). Tomato juice. Alcoholic drinks. Fats and oils Butter. Margarine. Shortening. Ghee. Sweets and desserts Chocolate and cocoa. Donuts. Seasoning and other foods Pepper. Peppermint and spearmint. Any condiments, herbs, or seasonings that cause symptoms. For some people, this  may include curry, hot sauce, or vinegar-based salad dressings. Summary  When you have gastroesophageal reflux disease (GERD), food and lifestyle choices are very important to help ease the discomfort of GERD.  Eat frequent, small meals instead of three large meals each day. Eat your meals slowly, in a relaxed setting. Avoid bending over or lying down until 2-3 hours after eating.  Limit high-fat foods such as fatty meat or fried foods. This information is not intended to replace advice given to you by your health care provider. Make sure you discuss any questions you have with your health care provider. Document Revised: 11/18/2018 Document Reviewed: 07/29/2016 Elsevier Patient Education  [place].

## 2020-04-27 IMAGING — MG DIGITAL DIAGNOSTIC UNILATERAL LEFT MAMMOGRAM WITH TOMO AND CAD
6 series · 6 of 18 positions shown · non-contrast
Comparison: Mammography 06/08/2017 (BILATERAL) 07/14/2016 (LEFT),
07/11/2016 (LEFT), 05/16/2016 (LEFT), 05/14/2016 (LEFT), 05/06/2016
(BILATERAL, baseline).

CLINICAL DATA: 42-year-old presenting with a palpable LEFT axillary
lump which she has noticed over the last 7 months. She also
complains of diffuse OUTER LEFT breast pain and generalized
lumpiness in the OUTER LEFT breast. Prior high risk excisional
biopsy from LEFT breast for a complex sclerosing lesion.

EXAM:
DIGITAL DIAGNOSTIC LEFT MAMMOGRAM WITH CAD AND TOMO
ULTRASOUND LEFT AXILLA

[L MLO synth-2D]
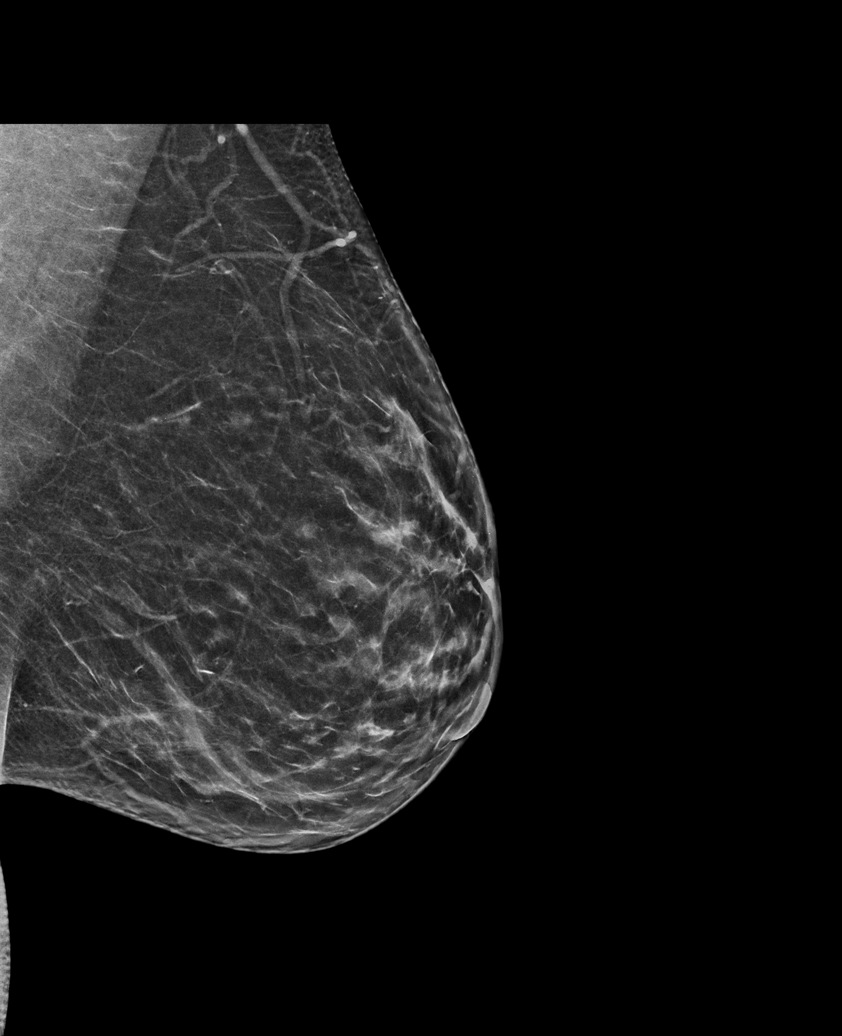

[L TAN synth-2D]
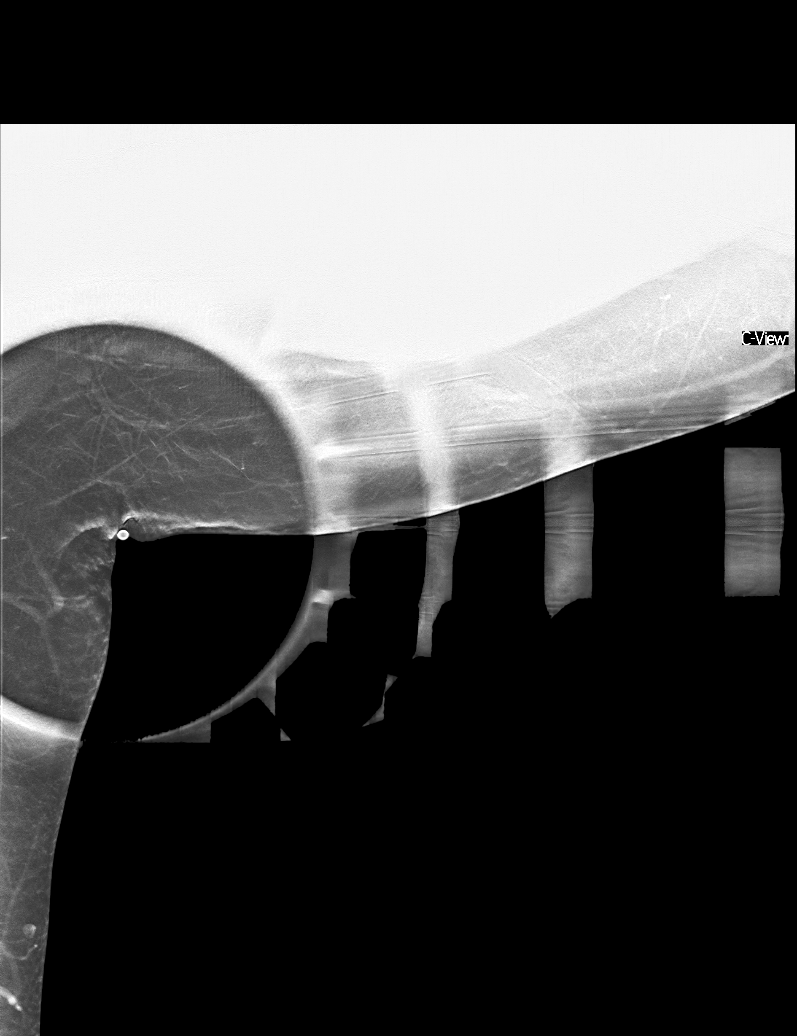

[L CC synth-2D]
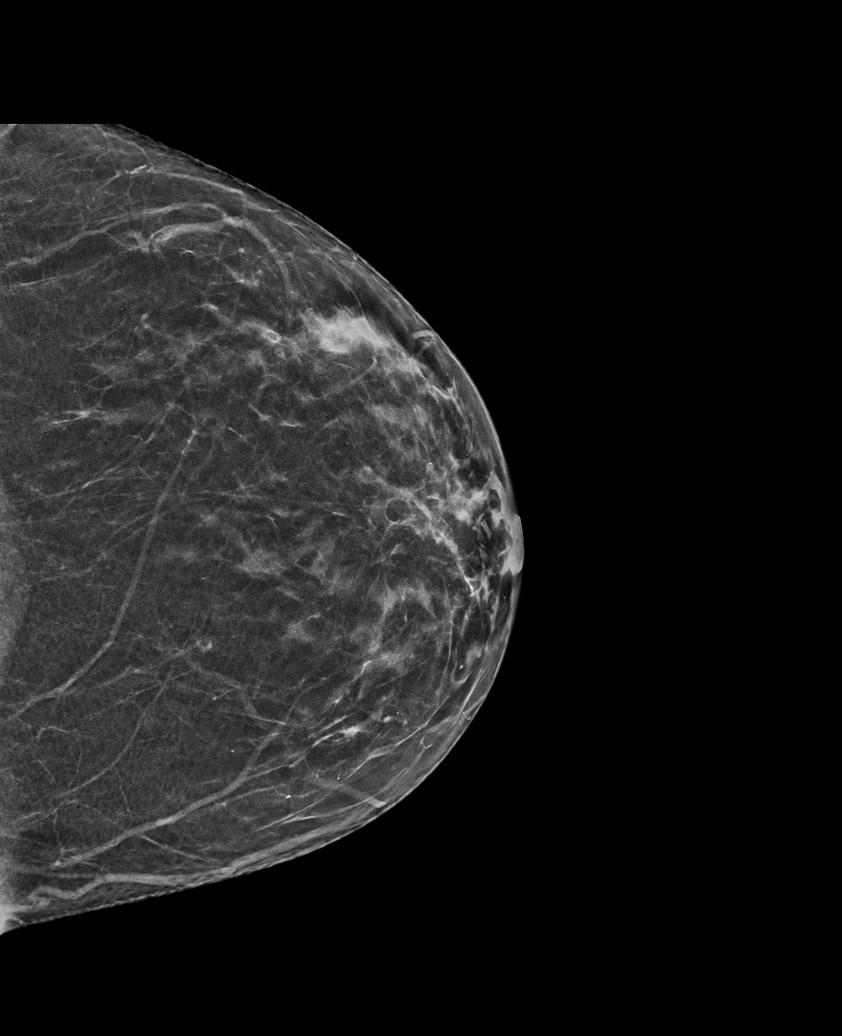

[L MLO tomo · tomo slice 35/69.0]
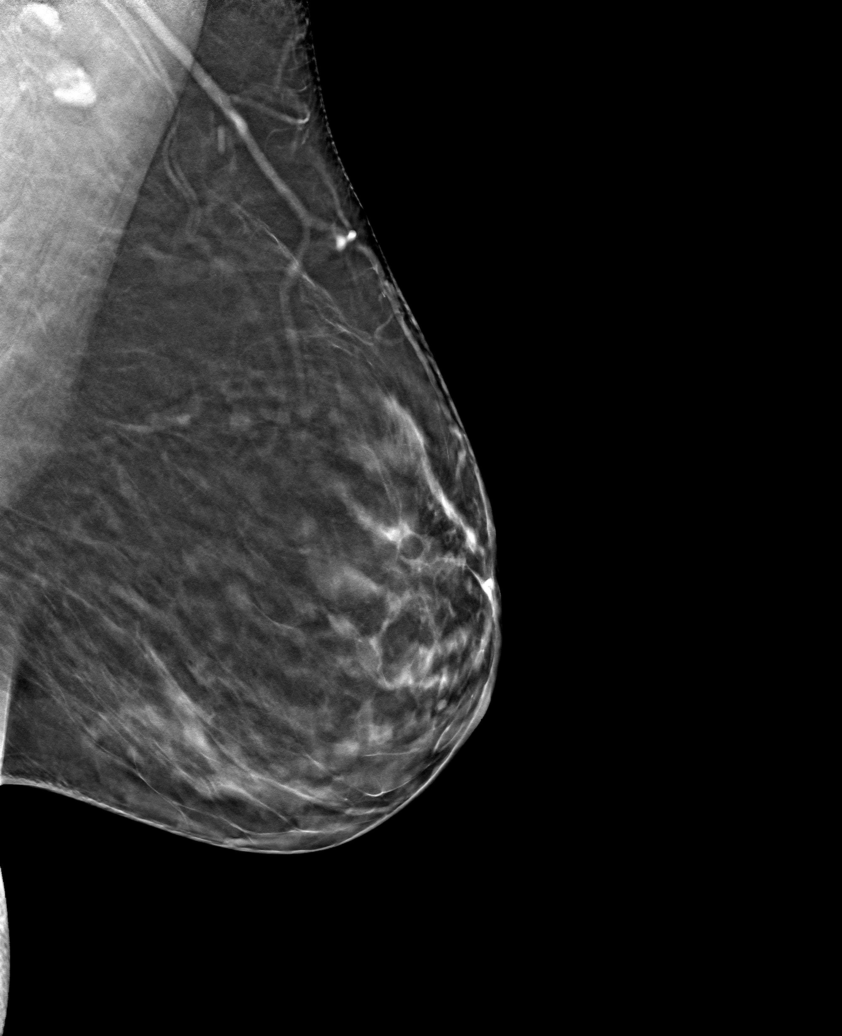

[L TAN tomo · tomo slice 25/49.0]
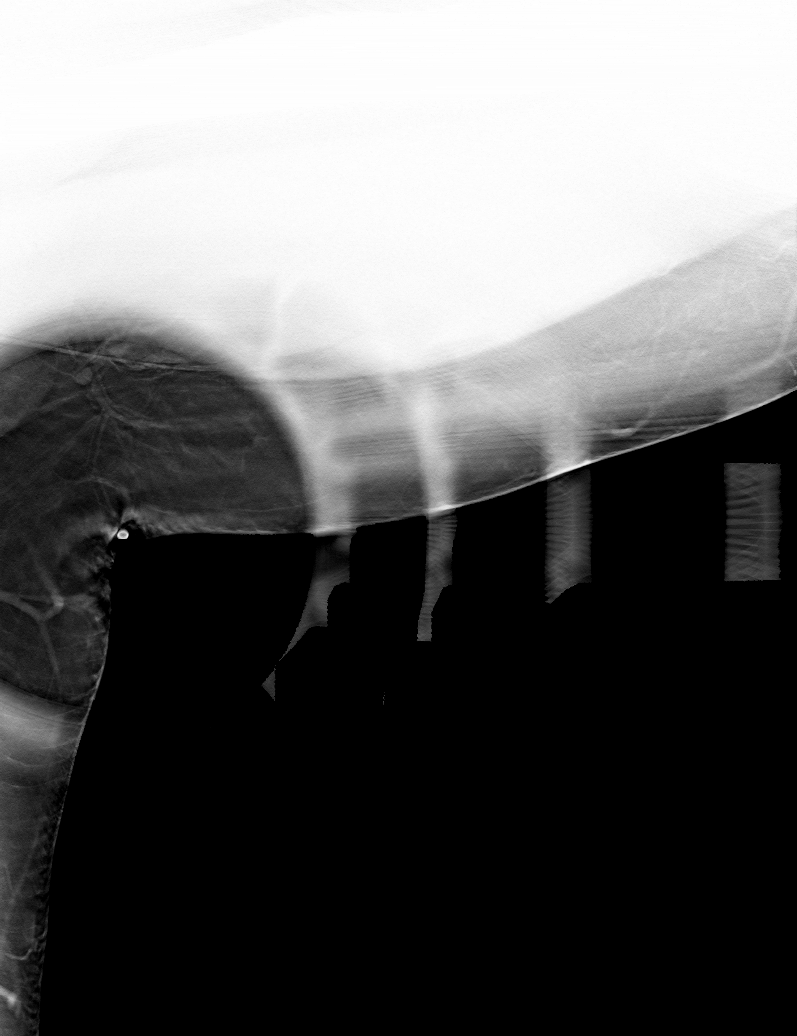

[L CC tomo · tomo slice 30/59.0]
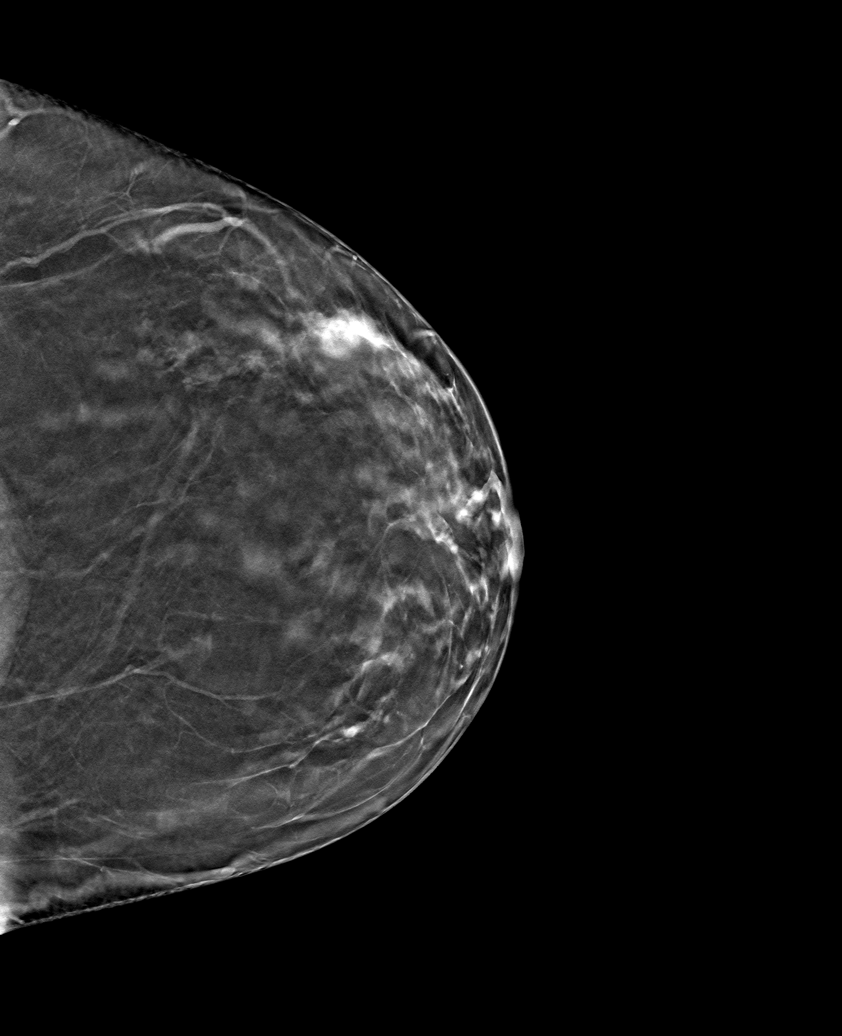

[6 of 18 positions shown; findings below may reference images not displayed]

LEFT axillary ultrasound 05/14/2016.

ACR Breast Density Category b: There are scattered areas of
fibroglandular density.
FINDINGS: Standard 2D and tomosynthesis full field CC and MLO views of the
LEFT breast were obtained. Tomosynthesis and synthesized spot
compression tangential view of the area of concern in the LEFT
axilla was also obtained.

No mammographic abnormality in the area of palpable concern in the
LEFT axilla apart from mild skin thickening. Normal axillary fat is
present and normal appearing lymph nodes are present deep to the
area of palpable concern.

No findings suspicious for malignancy in the LEFT breast.
Specifically, no mammographic abnormalities in the OUTER LEFT breast
in the area of diffuse pain. Expected post surgical
scar/architectural distortion associated with an oil cyst in the
UPPER LEFT breast at ANTERIOR depth at the site of the excisional
biopsy.

Mammographic images were processed with CAD.

On physical exam, there is focal palpable thickening of the skin of
the LEFT axilla in the area of patient concern. I do not palpate a
mass or lymphadenopathy.

Targeted LEFT axillary ultrasound is performed, showing minimal
focal thickening of the skin in the LEFT axilla in the area of
patient concern without evidence of an underlying mass or pathologic
lymphadenopathy.
IMPRESSION: 1. No mammographic evidence of malignancy involving the LEFT breast.
2. Focal thickening of the skin of the LEFT axilla which accounts
for the palpable concern. No evidence of axillary mass or pathologic
lymphadenopathy.

RECOMMENDATION:
Annual screening mammography which is due in 1 month.

I have discussed the findings and recommendations with the patient.
Results were also provided in writing at the conclusion of the
visit. If applicable, a reminder letter will be sent to the patient
regarding the next appointment.

BI-RADS CATEGORY  2: Benign.

## 2020-09-05 ENCOUNTER — Encounter: Payer: Self-pay | Admitting: Family Medicine

## 2020-09-05 ENCOUNTER — Telehealth: Payer: No Typology Code available for payment source | Admitting: Family Medicine

## 2020-09-05 DIAGNOSIS — R195 Other fecal abnormalities: Secondary | ICD-10-CM

## 2020-09-05 DIAGNOSIS — R140 Abdominal distension (gaseous): Secondary | ICD-10-CM | POA: Diagnosis not present

## 2020-09-05 DIAGNOSIS — R109 Unspecified abdominal pain: Secondary | ICD-10-CM | POA: Diagnosis not present

## 2020-09-05 NOTE — Progress Notes (Signed)
Virtual Visit via Video Note  I connected with Gilford Silvius on 09/05/20 at  1:30 PM EST by a video enabled telemedicine application 2/2 [id number] pandemic and verified that I am speaking with the correct person using two identifiers.  Location patient: home Location provider:work or home office Persons participating in the virtual visit: patient, provider  I discussed the limitations of evaluation and management by telemedicine and the availability of in person appointments. The patient expressed understanding and agreed to proceed.   HPI: Pt is a 45 yo female with pmh sig for Asthma, allergies, h/o depression, GERD, who was seen for ongoing concern.  Pt with low back pain, pain in belly button down to R side, pain in rectum, and 3-4 loose yellow stools/day x 3 wks.  Feels like her stomach is swollen.  Had a temp of 99.9 F for 2 days last wk.  Denies vomiting.  Tried pepto bismal, eating yogurt, and tucks pads.  Was taking voltaren tabs 75 mg BID after shoulder surgery 07/16/20, but stopped taking it 1 wk ago.  Denies changes in foods, blood in stools.  Is supposed to be on abx for dental infection, but has not started it as she read it can cause diarrhea.  Pt's 2 co-workers were COVID positive last Thrusday.  Pt had negative COVID test last wk.  Seen in the past by Dr. Loletha Carrow for colonoscopy.  Pt still has her appendix.    ROS: See pertinent positives and negatives per HPI.  Past Medical History:  Diagnosis Date  . Allergy   . ASTHMA 03/31/2007   no attack in years per patient   . Asthma    allergy related  . Breast mass, left   . CONTACT DERMATITIS&OTHER ECZEMA DUE TO PLANTS 12/03/2009  . Depression   . DYSFUNCTIONAL UTERINE BLEEDING 01/24/2010  . GERD (gastroesophageal reflux disease)   . Headache    sinus headaches   . HIP PAIN, RIGHT 01/22/2009  . LOW BACK PAIN, ACUTE 04/17/2008    Past Surgical History:  Procedure Laterality Date  . BREAST EXCISIONAL BIOPSY Left 2017  .  CHOLECYSTECTOMY N/A 12/25/2015   Procedure: LAPAROSCOPIC CHOLECYSTECTOMY;  Surgeon: Rolm Bookbinder, MD;  Location: [facility];  Service: General;  Laterality: N/A;  . LAPAROSCOPIC VAGINAL HYSTERECTOMY WITH SALPINGECTOMY Bilateral 10/14/2018   Procedure: LAPAROSCOPIC ASSISTED VAGINAL HYSTERECTOMY WITH SALPINGECTOMY;  Surgeon: Olga Millers, MD;  [facility];  Service: Gynecology;  Laterality: Bilateral;  . RADIOACTIVE SEED GUIDED EXCISIONAL BREAST BIOPSY Left 07/14/2016   Procedure: RADIOACTIVE SEED GUIDED EXCISIONAL BREAST BIOPSY;  Surgeon: Rolm Bookbinder, MD;  Location: [place];  Service: General;  Laterality: Left;  . TONSILLECTOMY AND ADENOIDECTOMY    . uterine abalation    . WISDOM TOOTH EXTRACTION      Family History  Problem Relation Age of Onset  . Depression Mother   . Diabetes Mother        diet controlled  . Transient ischemic attack Mother   . Colon polyps Mother        pre-cancerous  . Hypertension Father   . Diabetes Father        diet controlled  . Bowel Disease Father        blockage, had to wear a bag for awhile  . Lung cancer Paternal Grandfather   . Stroke Maternal Grandfather       Current Outpatient Medications:  .  diclofenac (VOLTAREN) 75 MG EC tablet, Take 75 mg by mouth 2 (  two) times daily., Disp: , Rfl:  .  Melatonin 2.5 MG CHEW, Chew 2 each by mouth at bedtime. As needed- gummies for sleep, Disp: , Rfl:  .  fluticasone (FLONASE) 50 MCG/ACT nasal spray, Place 1 spray into both nostrils daily. (Patient taking differently: Place 1 spray into both nostrils daily as needed for allergies. ), Disp: 16 g, Rfl: 0  EXAM:  VITALS per patient if applicable: RR between 76-22 bpm  GENERAL: alert, oriented, appears well and in no acute distress  HEENT: atraumatic, conjunctiva clear, no obvious abnormalities on inspection of external nose and ears  NECK: normal movements of the head and neck  LUNGS: on inspection no  signs of respiratory distress, breathing rate appears normal, no obvious gross SOB, gasping or wheezing  CV: no obvious cyanosis  MS: moves all visible extremities without noticeable abnormality  PSYCH/NEURO: pleasant and cooperative, no obvious depression or anxiety, speech and thought processing grossly intact  ASSESSMENT AND PLAN:  Discussed the following assessment and plan:  Loose stools  -Discussed possible causes including bacterial infection, viral enteritis, changes in diet -We will obtain stool studies -Patient to try probiotic and Imodium -Discussed other supportive care -Given strict precautions for continued or worsening symptoms - Plan: CBC with Differential/Platelet, CMP, Lipase, Ova and Parasite Exam, Other/Misc lab test, C. difficile GDH and Toxin A/B  Abdominal bloating with cramps -Discussed possible causes including IBS, bacterial infection, changes in diet. - Plan: Urinalysis, Ova and Parasite Exam, Other/Misc lab test, C. difficile GDH and Toxin A/B  Follow-up as needed  I discussed the assessment and treatment plan with the patient. The patient was provided an opportunity to ask questions and all were answered. The patient agreed with the plan and demonstrated an understanding of the instructions.   The patient was advised to call back or seek an in-person evaluation if the symptoms worsen or if the condition fails to improve as anticipated.   Billie Ruddy, MD

## 2020-09-06 ENCOUNTER — Other Ambulatory Visit (INDEPENDENT_AMBULATORY_CARE_PROVIDER_SITE_OTHER): Payer: No Typology Code available for payment source

## 2020-09-06 DIAGNOSIS — R109 Unspecified abdominal pain: Secondary | ICD-10-CM

## 2020-09-06 DIAGNOSIS — R140 Abdominal distension (gaseous): Secondary | ICD-10-CM

## 2020-09-06 DIAGNOSIS — R195 Other fecal abnormalities: Secondary | ICD-10-CM

## 2020-09-07 LAB — URINALYSIS
Bilirubin Urine: NEGATIVE
Hgb urine dipstick: NEGATIVE
Ketones, ur: NEGATIVE
Leukocytes,Ua: NEGATIVE
Nitrite: NEGATIVE
Specific Gravity, Urine: 1.015 (ref 1.000–1.030)
Total Protein, Urine: NEGATIVE
Urine Glucose: NEGATIVE
Urobilinogen, UA: 0.2 (ref 0.0–1.0)
pH: 8 (ref 5.0–8.0)

## 2020-09-07 LAB — CBC WITH DIFFERENTIAL/PLATELET
Basophils Absolute: 0 10*3/uL (ref 0.0–0.1)
Basophils Relative: 1.3 % (ref 0.0–3.0)
Eosinophils Absolute: 0.1 10*3/uL (ref 0.0–0.7)
Eosinophils Relative: 2.6 % (ref 0.0–5.0)
HCT: 39.7 % (ref 36.0–46.0)
Hemoglobin: 13.6 g/dL (ref 12.0–15.0)
Lymphocytes Relative: 35 % (ref 12.0–46.0)
Lymphs Abs: 1.3 10*3/uL (ref 0.7–4.0)
MCHC: 34.3 g/dL (ref 30.0–36.0)
MCV: 89.3 fl (ref 78.0–100.0)
Monocytes Absolute: 0.5 10*3/uL (ref 0.1–1.0)
Monocytes Relative: 13.4 % — ABNORMAL HIGH (ref 3.0–12.0)
Neutro Abs: 1.8 10*3/uL (ref 1.4–7.7)
Neutrophils Relative %: 47.7 % (ref 43.0–77.0)
Platelets: 209 10*3/uL (ref 150.0–400.0)
RBC: 4.44 Mil/uL (ref 3.87–5.11)
RDW: 12.5 % (ref 11.5–15.5)
WBC: 3.7 10*3/uL — ABNORMAL LOW (ref 4.0–10.5)

## 2020-09-07 LAB — COMPREHENSIVE METABOLIC PANEL
ALT: 46 U/L — ABNORMAL HIGH (ref 0–35)
AST: 29 U/L (ref 0–37)
Albumin: 4 g/dL (ref 3.5–5.2)
Alkaline Phosphatase: 74 U/L (ref 39–117)
BUN: 14 mg/dL (ref 6–23)
CO2: 29 mEq/L (ref 19–32)
Calcium: 9.3 mg/dL (ref 8.4–10.5)
Chloride: 103 mEq/L (ref 96–112)
Creatinine, Ser: 0.72 mg/dL (ref 0.40–1.20)
GFR: 99.81 mL/min (ref >60.00)
Glucose, Bld: 70 mg/dL (ref 70–99)
Potassium: 4.1 mEq/L (ref 3.5–5.1)
Sodium: 139 mEq/L (ref 135–145)
Total Bilirubin: 0.3 mg/dL (ref 0.2–1.2)
Total Protein: 6.7 g/dL (ref 6.0–8.3)

## 2020-09-07 LAB — LIPASE: Lipase: 33 U/L (ref 11.0–59.0)

## 2020-09-10 ENCOUNTER — Other Ambulatory Visit: Payer: No Typology Code available for payment source

## 2020-09-10 ENCOUNTER — Other Ambulatory Visit: Payer: Self-pay

## 2020-09-10 DIAGNOSIS — R195 Other fecal abnormalities: Secondary | ICD-10-CM

## 2020-09-10 DIAGNOSIS — R140 Abdominal distension (gaseous): Secondary | ICD-10-CM

## 2020-09-10 DIAGNOSIS — R109 Unspecified abdominal pain: Secondary | ICD-10-CM

## 2020-09-11 LAB — C. DIFFICILE GDH AND TOXIN A/B

## 2020-09-11 LAB — TIQ-NTM

## 2020-09-14 LAB — C. DIFFICILE GDH AND TOXIN A/B
GDH ANTIGEN: NOT DETECTED
MICRO NUMBER:: 11475940
SPECIMEN QUALITY:: ADEQUATE
TOXIN A AND B: NOT DETECTED

## 2020-09-14 LAB — SALMONELLA/SHIGELLA CULT, CAMPY EIA AND SHIGA TOXIN RFL ECOLI
MICRO NUMBER: 11480867
MICRO NUMBER:: 11480868
MICRO NUMBER:: 11480869
Result:: NOT DETECTED
SHIGA RESULT:: NOT DETECTED
SPECIMEN QUALITY: ADEQUATE
SPECIMEN QUALITY:: ADEQUATE
SPECIMEN QUALITY:: ADEQUATE

## 2020-09-14 LAB — OVA AND PARASITE EXAMINATION
CONCENTRATE RESULT:: NONE SEEN
MICRO NUMBER:: 11476569
SPECIMEN QUALITY:: ADEQUATE
TRICHROME RESULT:: NONE SEEN

## 2020-09-18 ENCOUNTER — Ambulatory Visit: Payer: No Typology Code available for payment source | Admitting: Nurse Practitioner

## 2020-09-18 ENCOUNTER — Encounter: Payer: Self-pay | Admitting: Nurse Practitioner

## 2020-09-18 VITALS — BP 100/62 | HR 90 | Ht 65.5 in | Wt 177.0 lb

## 2020-09-18 DIAGNOSIS — K582 Mixed irritable bowel syndrome: Secondary | ICD-10-CM

## 2020-09-18 DIAGNOSIS — R509 Fever, unspecified: Secondary | ICD-10-CM

## 2020-09-18 DIAGNOSIS — R1030 Lower abdominal pain, unspecified: Secondary | ICD-10-CM | POA: Diagnosis not present

## 2020-09-18 MED ORDER — HYOSCYAMINE SULFATE 0.125 MG SL SUBL: 0.1250 mg | SUBLINGUAL_TABLET | Freq: Three times a day (TID) | SUBLINGUAL | 0 refills | Status: DC | PRN

## 2020-09-18 MED ORDER — CIPROFLOXACIN HCL 500 MG PO TABS: 500.0000 mg | ORAL_TABLET | Freq: Two times a day (BID) | ORAL | 0 refills | Status: DC

## 2020-09-18 NOTE — Patient Instructions (Addendum)
If you are age 47 or older, your body mass index should be between 23-30. Your Body mass index is 29.01 kg/m. If this is out of the aforementioned range listed, please consider follow up with your Primary Care Provider.  If you are age 1 or younger, your body mass index should be between 19-25. Your Body mass index is 29.01 kg/m. If this is out of the aformentioned range listed, please consider follow up with your Primary Care Provider.   We have sent the following medications to your pharmacy for you to pick up at your convenience: Levsin and Ciprofloxacin.  Clear liquid diet for the next couple of days then a low fiber diet until feeling better.  Thank you for choosing me and [facility].  Tye Savoy, NP

## 2020-09-18 NOTE — Progress Notes (Signed)
ASSESSMENT AND PLAN    # 45 yo female with lower abdominal pain, low back pain, low grade temp. She has a history of IBS with alternating bowel habits which has been worse lately but this seems more than an IBS flare, especially with low grade fevers. Recent C-diff study negative, UA unremarkable and CMP and CBC normal.  She is significantly tender in lower abdomen on exam.  --We discussed CT scan to evaluate for diverticulitis vrs treating empirically for it this one time. She prefers to treat with a course of antibiotics for now but understands that antibiotics are no without certain risks. .Trial of Cipro and Flagyl x 7 days.  --Clear liquids for a couple of days then low fiber diet until feeling better.  --Follow up in 4-5 weeks, call in interim if not improving.  --Trial of Levsin SL. This was prescribed years ago but never tried it  # History of IBS. She may have mixed IBS. Generally her bowel pattern consists of several loose BMs one day, no BMs the next 1-2 days. Then she may have normal BMs for a couple of days before cycle starts over again with loose stools. She always feels incompletely evacuated.  She started fiber gummies less than two weeks ago which I have asked her to continue. I doubt there is enough fiber content in each gummy to be concerned even if she has diverticulitis.   HISTORY OF PRESENT ILLNESS     Primary Gastroenterologist : Wilfrid Lund, MD  Chief Complaint : abdominal pain Brandi Sutton is a 45 y.o. female with PMH / [place] significant for,  but not necessarily limited [facility]:[id number], IBS  Patient was last seen in December 2019 for pelvic pain and alternating constipation and diarrhea. She later had a hysterectomy for suspected adenomyosis and pelvic pain resolved. Uterine myometrium c/w leiomyoma and adenomyoma.    Patient  is here for evaluation of diffuse lower abdominal pain. Several weeks ago she developed non-bloody diarrhea , generalized lower  abdominal pain, low back pain and low grade fevers.  Had Telehealth visit with PCP. O+P and C-diff negative.  U/A negative.  CBC was okay. ALT 46, CMET otherwise normal. Started on fiber + probiotics, also taking a pre-biotic.  Diarrhea and pain resolved , she felt better for ~ a week then yesterday developed recurrent diffuse lower abdominal pain and  temp of 100.2.  She gets only transient improvement in lower abdominal pain and lower back pain after BM. Patient says she has never had this type of abdominal pain before. No urinary symptoms. Regarding bowel movements / history of IBS. Patient describes always feeling incompletely evacuated even after several loose or solid BMs.      Previous Endoscopic Evaluations / Pertinent Studies:  Nov 2017 colonoscopy --Multiple diverticula in left and right colon  Past Medical History:  Diagnosis Date  . Allergy   . ASTHMA 03/31/2007   no attack in years per patient   . Asthma    allergy related  . Breast mass, left   . CONTACT DERMATITIS&OTHER ECZEMA DUE TO PLANTS 12/03/2009  . Depression   . DYSFUNCTIONAL UTERINE BLEEDING 01/24/2010  . GERD (gastroesophageal reflux disease)   . Headache    sinus headaches   . HIP PAIN, RIGHT 01/22/2009  . LOW BACK PAIN, ACUTE 04/17/2008     Past Surgical History:  Procedure Laterality Date  . BREAST EXCISIONAL BIOPSY Left 2017  . CHOLECYSTECTOMY N/A 12/25/2015   Procedure: LAPAROSCOPIC CHOLECYSTECTOMY;  Surgeon: Rolm Bookbinder, MD;  Location: [facility];  Service: General;  Laterality: N/A;  . LAPAROSCOPIC VAGINAL HYSTERECTOMY WITH SALPINGECTOMY Bilateral 10/14/2018   Procedure: LAPAROSCOPIC ASSISTED VAGINAL HYSTERECTOMY WITH SALPINGECTOMY;  Surgeon: Olga Millers, MD;  Location: [facility];  Service: Gynecology;  Laterality: Bilateral;  . RADIOACTIVE SEED GUIDED EXCISIONAL BREAST BIOPSY Left 07/14/2016   Procedure: RADIOACTIVE SEED GUIDED EXCISIONAL BREAST BIOPSY;  Surgeon: Rolm Bookbinder, MD;   Location: [facility];  Service: General;  Laterality: Left;  . TONSILLECTOMY AND ADENOIDECTOMY    . uterine abalation    . WISDOM TOOTH EXTRACTION     Family History  Problem Relation Age of Onset  . Depression Mother   . Diabetes Mother        diet controlled  . Transient ischemic attack Mother   . Colon polyps Mother        pre-cancerous  . Hypertension Father   . Diabetes Father        diet controlled  . Bowel Disease Father        blockage, had to wear a bag for awhile  . Lung cancer Paternal Grandfather   . Stroke Maternal Grandfather    Social History   Tobacco Use  . Smoking status: Former Smoker    Types: Cigarettes    Quit date: 12/09/2005    Years since quitting: 14.7  . Smokeless tobacco: Never Used  Vaping Use  . Vaping Use: Never used  Substance Use Topics  . Alcohol use: Yes    Alcohol/week: 0.0 standard drinks    Comment: 1 beers per week liquor 1x week   . Drug use: No   Current Outpatient Medications  Medication Sig Dispense Refill  . diclofenac (VOLTAREN) 75 MG EC tablet Take 75 mg by mouth 2 (two) times daily.    . Melatonin 2.5 MG CHEW Chew 2 each by mouth at bedtime. As needed- gummies for sleep    . fluticasone (FLONASE) 50 MCG/ACT nasal spray Place 1 spray into both nostrils daily. (Patient taking differently: Place 1 spray into both nostrils daily as needed for allergies. ) 16 g 0   No current facility-administered medications for this visit.   Allergies  Allergen Reactions  . Penicillins Hives and Itching    Has patient had a PCN reaction causing immediate rash, facial/tongue/throat swelling, SOB or lightheadedness with hypotension: no Has patient had a PCN reaction causing severe rash involving mucus membranes or skin necrosis: no Has patient had a PCN reaction that required hospitalization no Has patient had a PCN reaction occurring within the last 10 years: yes If all of the above answers are "NO", then may proceed with  Cephalosporin use.   . Bupropion Hcl Hives and Rash  . Sulfa Antibiotics Rash     Review of Systems: All systems reviewed and negative except where noted in HPI.   PHYSICAL EXAM :    Wt Readings from Last 3 Encounters:  09/18/20 177 lb (80.3 kg)  01/02/20 188 lb (85.3 kg)  10/14/18 178 lb 1.6 oz (80.8 kg)    BP 100/62   Pulse 90   Ht 5' 5.5" (1.664 m)   Wt 177 lb (80.3 kg)   LMP 03/12/2011   BMI 29.01 kg/m  Constitutional:  Pleasant female in no acute distress. Psychiatric: Normal mood and affect. Behavior is normal. EENT: Pupils normal.  Conjunctivae are normal. No scleral icterus. Neck supple.  Cardiovascular: Normal rate, regular rhythm. No edema Pulmonary/chest: Effort normal and  breath sounds normal. No wheezing, rales or rhonchi. Abdominal: Soft, nondistended, moderate diffuse lower abdominal tenderness.  Bowel sounds active throughout. There are no masses palpable. No hepatomegaly. Neurological: Alert and oriented to person place and time. Skin: Skin is warm and dry. No rashes noted.  Tye Savoy, NP  09/18/2020, 3:35 PM  Cc:  Referring Provider Billie Ruddy, MD

## 2020-09-19 NOTE — Progress Notes (Signed)
____________________________________________________________  Attending physician addendum:  Thank you for sending this case to me. I have reviewed the entire note and agree with the plan.  Please have nursing follow up by phone in 10-14 days.  If patient is not improved, she should be scheduled for a colonoscopy with me.  Wilfrid Lund, MD  ____________________________________________________________

## 2020-09-24 ENCOUNTER — Encounter: Payer: Self-pay | Admitting: Family Medicine

## 2020-10-03 ENCOUNTER — Telehealth: Payer: Self-pay

## 2020-10-03 NOTE — Telephone Encounter (Signed)
Thanks for the update. If she has recurrent pain / problems with bowel movements then she needs to let us know and we can proceed with a colonoscopy. Thanks

## 2020-10-03 NOTE — Telephone Encounter (Signed)
Called patient to get update on her symptoms. She states she is better, only a little lower abdominal discomfort now and she has not had diarrhea in about 5 days.

## 2020-10-03 NOTE — Telephone Encounter (Signed)
-----   Message from Greggory Keen, LPN sent at 1/85/6314 10:36 AM EST ----- Seen for lower abdominal pain. Hx of IBS. Treated empirically for diverticulitis.  Patient of Dr Loletha Carrow. He wants to have her scheduled for a colonoscopy if she is not improved significantly. See the office note 09/18/20 for details. ----- Message ----- From: Willia Craze, NP Sent: 09/19/2020   5:05 PM EST To: Greggory Keen, LPN  Beth, please make a note to call patient in two weeks for a condition update and let me know. Thanks

## 2020-10-04 ENCOUNTER — Telehealth: Payer: Self-pay

## 2020-10-04 NOTE — Telephone Encounter (Signed)
-----   Message from Doran Stabler, MD sent at 10/04/2020  6:50 AM EST ----- Please see PG's clinic note as well as my brief note attached to it.  I would like a phone call to this patient for an update on symptoms so I can decide if a colonoscopy is necessary.  - HD

## 2020-10-04 NOTE — Telephone Encounter (Signed)
Duplicate, see 5/40/08 telephone encounter.

## 2020-10-04 NOTE — Telephone Encounter (Signed)
Thank you for the update, I am glad to hear she is feeling better.  - HD

## 2020-11-07 ENCOUNTER — Ambulatory Visit: Payer: No Typology Code available for payment source | Admitting: Gastroenterology

## 2020-11-07 ENCOUNTER — Encounter: Payer: Self-pay | Admitting: Gastroenterology

## 2020-11-07 VITALS — BP 119/68 | HR 75 | Ht 66.0 in | Wt 176.4 lb

## 2020-11-07 DIAGNOSIS — K625 Hemorrhage of anus and rectum: Secondary | ICD-10-CM

## 2020-11-07 DIAGNOSIS — K5732 Diverticulitis of large intestine without perforation or abscess without bleeding: Secondary | ICD-10-CM | POA: Diagnosis not present

## 2020-11-07 DIAGNOSIS — K582 Mixed irritable bowel syndrome: Secondary | ICD-10-CM

## 2020-11-07 NOTE — Patient Instructions (Signed)
If you are age 47 or older, your body mass index should be between 23-30. Your Body mass index is 28.47 kg/m. If this is out of the aforementioned range listed, please consider follow up with your Primary Care Provider.  If you are age 1 or younger, your body mass index should be between 19-25. Your Body mass index is 28.47 kg/m. If this is out of the aformentioned range listed, please consider follow up with your Primary Care Provider.   We have given you samples of Linzess 135mcg and 76mcg  Let us know which one works better for you.   It was a pleasure to see you today!  Dr. Loletha Carrow

## 2020-11-07 NOTE — Progress Notes (Signed)
[facility] Progress Note  Chief Complaint: Rectal bleeding and diverticulitis  Subjective  History: I last saw Brandi Sutton in December 2019 for a history of IBS with alternating constipation and diarrhea and also for pelvic pain which I felt was not likely GI in origin.  In March 2020 she one underwent laparoscopic assisted vaginal hysterectomy with salpingectomy.  Brandi Sutton was then seen by our NP on February 8 for several weeks of lower abdominal pain, low-grade fever, negative C. difficile test normal urinalysis, CBC and CMP. She was treated empirically for diverticulitis with no imaging. Last colonoscopy in November 2017 with left and right-sided diverticulosis, otherwise normal exam.  Recall recommended in 10 years. _______________  Brandi Sutton tells me that the abdominal pain significantly improved with antibiotics, indicating it most likely was diverticulitis.  However, she is still struggling with her bowel habits.  She feels it is primarily constipation where stool is not passing well and she has to sit and sometimes strain for a while.  Then other times she will have urgent need for bowel movements and have multiple loose stools.  Fiber supplement has not seem to help much.  And the constipation is now giving her hemorrhoidal pain and bleeding.   ROS: Cardiovascular:  no chest pain Respiratory: no dyspnea Joint pain Remainder of systems negative except as above The patient's Past Medical, Family and Social History were reviewed and are on file in the EMR.  Objective:  Med list reviewed  Current Outpatient Medications:  .  diclofenac (VOLTAREN) 75 MG EC tablet, Take 75 mg by mouth 2 (two) times daily., Disp: , Rfl:  .  Melatonin 2.5 MG CHEW, Chew 2 each by mouth at bedtime. As needed- gummies for sleep, Disp: , Rfl:    Vital signs in last 24 hrs: Vitals:   11/07/20 1402  BP: 119/68  Pulse: 75  SpO2: 100%   Wt Readings from Last 3 Encounters:  11/07/20 176 lb 6.4 oz  (80 kg)  09/18/20 177 lb (80.3 kg)  01/02/20 188 lb (85.3 kg)    Physical Exam  Well-appearing  HEENT: sclera anicteric, oral mucosa moist without lesions  Neck: supple, no thyromegaly, JVD or lymphadenopathy  Cardiac: RRR without murmurs, S1S2 heard, no peripheral edema  Pulm: clear to auscultation bilaterally, normal RR and effort noted  Abdomen: soft, scattered tenderness, with active bowel sounds. No guarding or palpable hepatosplenomegaly.  Skin; warm and dry, no jaundice or rash Rectal: Normal external, normal DRE without fissure tenderness or palpable internal lesion.  In addition, normal resting tone, but somewhat decreased voluntary sphincter tone and puborectalis contraction.  Normal rectal descent with bearing down. Anoscopy: No enlargement of the internal hemorrhoidal plexus was seen.  Normal distal rectal mucosa  Labs:   ___________________________________________ Radiologic studies:   ____________________________________________ Other:   _____________________________________________ Assessment & Plan  Assessment: Encounter Diagnoses  Name Primary?  . Irritable bowel syndrome with both constipation and diarrhea Yes  . Rectal bleeding   . Diverticulitis of colon   Recent diverticulitis is cleared up  IBS with alternating bowel habits, though I think it is primarily constipation from the sounds of it.  I discussed the challenge in managing this type of bowel habit, need for caution with most of our available medicines, as that may precipitate diarrhea.  We gave her samples of Linzess 72 mcg and 145 mcg to start with the lower for about a week and then increase if needed.  She has benign anal bleeding related to intermittent  constipation with straining, no hemorrhoids seen on exam.  I asked patient contact our office within a few weeks with an update on symptoms and we will proceed accordingly.   31 minutes were spent on this encounter (including chart  review, history/exam, counseling/coordination of care, and documentation) > 50% of that time was spent on counseling and coordination of care.  Topics discussed included: Management of IBS and rectal bleeding.  Nelida Meuse III

## 2020-11-16 ENCOUNTER — Telehealth: Payer: Self-pay | Admitting: Gastroenterology

## 2020-11-16 DIAGNOSIS — K5732 Diverticulitis of large intestine without perforation or abscess without bleeding: Secondary | ICD-10-CM

## 2020-11-16 DIAGNOSIS — R1030 Lower abdominal pain, unspecified: Secondary | ICD-10-CM

## 2020-11-16 NOTE — Telephone Encounter (Signed)
Pt states that she has been experiencing left lower abd pain and mild nausea. Sxs began yesterday afternoon. Pt reported that it woke her up in the middle of the night. Pain eases and then comes back again. She would like some advise.

## 2020-11-16 NOTE — Telephone Encounter (Signed)
It has been difficult to tell what episodes may truly be diverticulitis with her underlying IBS.  Incidentally, the intention giving her both doses of Linzess was to try the 72 mcg dose and, only if that did not relieve constipation, to then go up to the 145 mcg dose. Since stool is loose on the 72 mcg dose,  I recommend not taking the 145 mcg dose   According to [facility] Feb clinic note, patient has Levsin to take as needed for abdominal cramps.  Please arrange a CT scan abdomen and pelvis with oral and IV contrast for early next week to evaluate for diverticulitis.

## 2020-11-16 NOTE — Telephone Encounter (Signed)
Spoke with patient, she reports that she is having the same pain in her LLQ , reports that the pain was severe this morning at 2 AM. Patient states that the pain gradually moves to the center and right side of her abdomen/pelvis. She reports that she has been having diarrhea with Linzess anyway so she wouldn't be able to tell the difference. She reports that she took 8 days of Linzess 72 mcg and today is first day of Linzess 145 mcg. No fever this morning. Patient completed the one course of Cipro and was feeling better. Patient would like to know what next steps are she states that a CT scan was mentioned or colonoscopy. Please advise, thanks.

## 2020-11-16 NOTE — Telephone Encounter (Signed)
Spoke with patient in regards to recommendations. Patient has been scheduled for a CT scan at [facility] on Tuesday, 11/20/20 at 10:45 AM, arrive at 10:30 AM. NPO 4 hours prior. Drink 1st bottle of contrast at 8:45 AM and second bottle at 9:45 AM. Patient is aware that she will need to stop by our office on Monday before 4:30 PM to pick up the 2 bottles of contrast that she will need. Advised that I will send CT appt information and instructions to her My Chart. Patient will let us know if she needs a refill on Levsin. Patient verbalized understanding of all information and had no concerns at the end of the call.   2 bottles of contrast placed at receptionist desk.

## 2020-11-20 ENCOUNTER — Ambulatory Visit (INDEPENDENT_AMBULATORY_CARE_PROVIDER_SITE_OTHER)
Admission: RE | Admit: 2020-11-20 | Discharge: 2020-11-20 | Disposition: A | Discharge status: Home / Self Care | Payer: No Typology Code available for payment source | Source: Ambulatory Visit | Attending: Gastroenterology | Admitting: Gastroenterology

## 2020-11-20 ENCOUNTER — Other Ambulatory Visit: Payer: Self-pay

## 2020-11-20 DIAGNOSIS — K5732 Diverticulitis of large intestine without perforation or abscess without bleeding: Secondary | ICD-10-CM | POA: Diagnosis not present

## 2020-11-20 DIAGNOSIS — R1030 Lower abdominal pain, unspecified: Secondary | ICD-10-CM

## 2020-11-20 MED ORDER — IOHEXOL 300 MG/ML  SOLN
100.0000 mL | Freq: Once | INTRAMUSCULAR | Status: AC | PRN
  Administered 2020-11-20: 100 mL via INTRAVENOUS

## 2020-11-22 ENCOUNTER — Other Ambulatory Visit: Payer: Self-pay | Admitting: Gastroenterology

## 2020-11-22 DIAGNOSIS — R1032 Left lower quadrant pain: Secondary | ICD-10-CM

## 2020-11-22 MED ORDER — HYOSCYAMINE SULFATE 0.125 MG SL SUBL: 0.1250 mg | SUBLINGUAL_TABLET | Freq: Four times a day (QID) | SUBLINGUAL | 0 refills | Status: DC | PRN

## 2021-01-01 ENCOUNTER — Other Ambulatory Visit: Payer: Self-pay

## 2021-01-02 ENCOUNTER — Ambulatory Visit (INDEPENDENT_AMBULATORY_CARE_PROVIDER_SITE_OTHER): Payer: No Typology Code available for payment source | Admitting: Family Medicine

## 2021-01-02 ENCOUNTER — Encounter: Payer: Self-pay | Admitting: Family Medicine

## 2021-01-02 VITALS — BP 122/86 | HR 68 | Temp 98.2°F | Ht 66.0 in | Wt 179.8 lb

## 2021-01-02 DIAGNOSIS — L0100 Impetigo, unspecified: Secondary | ICD-10-CM

## 2021-01-02 DIAGNOSIS — H1031 Unspecified acute conjunctivitis, right eye: Secondary | ICD-10-CM | POA: Diagnosis not present

## 2021-01-02 DIAGNOSIS — Z13220 Encounter for screening for lipoid disorders: Secondary | ICD-10-CM | POA: Diagnosis not present

## 2021-01-02 DIAGNOSIS — Z0000 Encounter for general adult medical examination without abnormal findings: Secondary | ICD-10-CM

## 2021-01-02 DIAGNOSIS — K219 Gastro-esophageal reflux disease without esophagitis: Secondary | ICD-10-CM

## 2021-01-02 DIAGNOSIS — Z1159 Encounter for screening for other viral diseases: Secondary | ICD-10-CM | POA: Diagnosis not present

## 2021-01-02 DIAGNOSIS — J302 Other seasonal allergic rhinitis: Secondary | ICD-10-CM | POA: Diagnosis not present

## 2021-01-02 DIAGNOSIS — Z1331 Encounter for screening for depression: Secondary | ICD-10-CM

## 2021-01-02 LAB — BASIC METABOLIC PANEL
BUN: 10 mg/dL (ref 6–23)
CO2: 29 mEq/L (ref 19–32)
Calcium: 9.2 mg/dL (ref 8.4–10.5)
Chloride: 105 mEq/L (ref 96–112)
Creatinine, Ser: 0.72 mg/dL (ref 0.40–1.20)
GFR: 99.59 mL/min (ref >60.00)
Glucose, Bld: 87 mg/dL (ref 70–99)
Potassium: 4.1 mEq/L (ref 3.5–5.1)
Sodium: 141 mEq/L (ref 135–145)

## 2021-01-02 LAB — CBC WITH DIFFERENTIAL/PLATELET
Basophils Absolute: 0 10*3/uL (ref 0.0–0.1)
Basophils Relative: 1 % (ref 0.0–3.0)
Eosinophils Absolute: 0.2 10*3/uL (ref 0.0–0.7)
Eosinophils Relative: 3 % (ref 0.0–5.0)
HCT: 41.8 % (ref 36.0–46.0)
Hemoglobin: 14.2 g/dL (ref 12.0–15.0)
Lymphocytes Relative: 28.5 % (ref 12.0–46.0)
Lymphs Abs: 1.4 10*3/uL (ref 0.7–4.0)
MCHC: 34 g/dL (ref 30.0–36.0)
MCV: 90.8 fl (ref 78.0–100.0)
Monocytes Absolute: 0.3 10*3/uL (ref 0.1–1.0)
Monocytes Relative: 6.7 % (ref 3.0–12.0)
Neutro Abs: 3.1 10*3/uL (ref 1.4–7.7)
Neutrophils Relative %: 60.8 % (ref 43.0–77.0)
Platelets: 237 10*3/uL (ref 150.0–400.0)
RBC: 4.6 Mil/uL (ref 3.87–5.11)
RDW: 13.1 % (ref 11.5–15.5)
WBC: 5.1 10*3/uL (ref 4.0–10.5)

## 2021-01-02 LAB — LIPID PANEL
Cholesterol: 186 mg/dL (ref 0–200)
HDL: 55.3 mg/dL (ref >39.00)
LDL Cholesterol: 110 mg/dL — ABNORMAL HIGH (ref 0–99)
NonHDL: 130.79
Total CHOL/HDL Ratio: 3
Triglycerides: 102 mg/dL (ref 0.0–149.0)
VLDL: 20.4 mg/dL (ref 0.0–40.0)

## 2021-01-02 LAB — TSH: TSH: 1.61 u[IU]/mL (ref 0.35–4.50)

## 2021-01-02 LAB — VITAMIN B12: Vitamin B-12: 380 pg/mL (ref 211–911)

## 2021-01-02 LAB — T4, FREE: Free T4: 0.78 ng/dL (ref 0.60–1.60)

## 2021-01-02 LAB — HEMOGLOBIN A1C: Hgb A1c MFr Bld: 5.3 % (ref 4.6–6.5)

## 2021-01-02 MED ORDER — OFLOXACIN 0.3 % OP SOLN: 1.0000 [drp] | Freq: Four times a day (QID) | OPHTHALMIC | 0 refills | Status: DC

## 2021-01-02 MED ORDER — MUPIROCIN CALCIUM 2 % EX CREA: 1.0000 "application " | TOPICAL_CREAM | Freq: Two times a day (BID) | CUTANEOUS | 0 refills | Status: DC

## 2021-01-02 NOTE — Patient Instructions (Signed)
Preventive Care 84-47 Years Old, Female Preventive care refers to lifestyle choices and visits with your health care provider that can promote health and wellness. This includes:  A yearly physical exam. This is also called an annual wellness visit.  Regular dental and eye exams.  Immunizations.  Screening for certain conditions.  Healthy lifestyle choices, such as: ? Eating a healthy diet. ? Getting regular exercise. ? Not using drugs or products that contain nicotine and tobacco. ? Limiting alcohol use. What can I expect for my preventive care visit? Physical exam Your health care provider will check your:  Height and weight. These may be used to calculate your BMI (body mass index). BMI is a measurement that tells if you are at a healthy weight.  Heart rate and blood pressure.  Body temperature.  Skin for abnormal spots. Counseling Your health care provider may ask you questions about your:  Past medical problems.  Family's medical history.  Alcohol, tobacco, and drug use.  Emotional well-being.  Home life and relationship well-being.  Sexual activity.  Diet, exercise, and sleep habits.  Work and work Statistician.  Access to firearms.  Method of birth control.  Menstrual cycle.  Pregnancy history. What immunizations do I need? Vaccines are usually given at various ages, according to a schedule. Your health care provider will recommend vaccines for you based on your age, medical history, and lifestyle or other factors, such as travel or where you work.   What tests do I need? Blood tests  Lipid and cholesterol levels. These may be checked every 5 years, or more often if you are over 3 years old.  Hepatitis C test.  Hepatitis B test. Screening  Lung cancer screening. You may have this screening every year starting at age 73 if you have a 30-pack-year history of smoking and currently smoke or have quit within the past 15 years.  Colorectal cancer  screening. ? All adults should have this screening starting at age 52 and continuing until age 17. ? Your health care provider may recommend screening at age 49 if you are at increased risk. ? You will have tests every 1-10 years, depending on your results and the type of screening test.  Diabetes screening. ? This is done by checking your blood sugar (glucose) after you have not eaten for a while (fasting). ? You may have this done every 1-3 years.  Mammogram. ? This may be done every 1-2 years. ? Talk with your health care provider about when you should start having regular mammograms. This may depend on whether you have a family history of breast cancer.  BRCA-related cancer screening. This may be done if you have a family history of breast, ovarian, tubal, or peritoneal cancers.  Pelvic exam and Pap test. ? This may be done every 3 years starting at age 10. ? Starting at age 11, this may be done every 5 years if you have a Pap test in combination with an HPV test. Other tests  STD (sexually transmitted disease) testing, if you are at risk.  Bone density scan. This is done to screen for osteoporosis. You may have this scan if you are at high risk for osteoporosis. Talk with your health care provider about your test results, treatment options, and if necessary, the need for more tests. Follow these instructions at home: Eating and drinking  Eat a diet that includes fresh fruits and vegetables, whole grains, lean protein, and low-fat dairy products.  Take vitamin and mineral supplements  as recommended by your health care provider.  Do not drink alcohol if: ? Your health care provider tells you not to drink. ? You are pregnant, may be pregnant, or are planning to become pregnant.  If you drink alcohol: ? Limit how much you have to 0-1 drink a day. ? Be aware of how much alcohol is in your drink. In the [place] one drink equals one 12 oz bottle of beer (355 mL), one 5 oz glass of  wine (148 mL), or one 1 oz glass of hard liquor (44 mL).   Lifestyle  Take daily care of your teeth and gums. Brush your teeth every morning and night with fluoride toothpaste. Floss one time each day.  Stay active. Exercise for at least 30 minutes 5 or more days each week.  Do not use any products that contain nicotine or tobacco, such as cigarettes, e-cigarettes, and chewing tobacco. If you need help quitting, ask your health care provider.  Do not use drugs.  If you are sexually active, practice safe sex. Use a condom or other form of protection to prevent STIs (sexually transmitted infections).  If you do not wish to become pregnant, use a form of birth control. If you plan to become pregnant, see your health care provider for a prepregnancy visit.  If told by your health care provider, take low-dose aspirin daily starting at age 39.  Find healthy ways to cope with stress, such as: ? Meditation, yoga, or listening to music. ? Journaling. ? Talking to a trusted person. ? Spending time with friends and family. Safety  Always wear your seat belt while driving or riding in a vehicle.  Do not drive: ? If you have been drinking alcohol. Do not ride with someone who has been drinking. ? When you are tired or distracted. ? While texting.  Wear a helmet and other protective equipment during sports activities.  If you have firearms in your house, make sure you follow all gun safety procedures. What's next?  Visit your health care provider once a year for an annual wellness visit.  Ask your health care provider how often you should have your eyes and teeth checked.  Stay up to date on all vaccines. This information is not intended to replace advice given to you by your health care provider. Make sure you discuss any questions you have with your health care provider. Document Revised: 05/01/2020 Document Reviewed: 04/08/2018 Elsevier Patient Education  [place].  Treatment of Skin Disease: Comprehensive Therapeutic Strategies (4th ed., pp. 932-671). Elsevier [employer]. Retrieved from https://www.clinicalkey.com/#!/content/book/3-s2.(902) 157-3649 X?scrollTo=%23hl0000032">  Impetigo, Adult Impetigo is an infection of the skin. It commonly occurs in young children, but it can also occur in adults. The infection causes itchy blisters and sores that produce brownish-yellow fluid. As the fluid dries, it forms a thick, honey-colored crust. These skin changes usually occur on the face, but they can also affect other areas of the body. Impetigo usually goes away in 7-10 days with treatment. What are the causes? This condition is caused by two types of bacteria. It may be caused by staphylococci or streptococci bacteria. These bacteria cause impetigo when they get under the surface of the skin. This often happens after some damage to the skin, such as:  Cuts, scrapes, or scratches.  Rashes.  Insect bites, especially when you scratch the area of a bite.  Chickenpox or other illnesses that cause open skin sores.  Nail biting or chewing. Impetigo can spread easily from one  person to another (is contagious). It may be spread through close skin contact or by sharing towels, clothing, or other items that an infected person has touched. Scratching the affected area can cause impetigo to spread to other parts of the body. The bacteria can get under your fingernails and spread when you touch another area of your skin. What increases the risk? The following factors may make you more likely to develop this condition:  Playing sports that include skin-to-skin contact with others.  Having broken skin, such as from a cut or scrape.  Living in an area that has high humidity levels.  Having poor hygiene.  Having high levels of staphylococci in your nose.  Having a condition that weakens the skin integrity, such as: ? Having a weak body defense system  (immune system). ? Having a skin condition with open sores, such as chickenpox. ? Having diabetes. What are the signs or symptoms? The main symptom of this condition is small blisters, often on the face around the mouth and nose. In time, the blisters break open and turn into tiny sores (lesions) with a yellow crust. In some cases, the blisters cause itching or burning. Scratching, irritation, or lack of treatment may cause these small lesions to get larger. Other possible symptoms include:  Larger blisters.  Pus.  Swollen lymph glands. How is this diagnosed? This condition is usually diagnosed during a physical exam. A skin sample or a sample of fluid from a blister may be taken for lab tests that involve growing bacteria (culture test). Lab tests can help confirm the diagnosis or help determine the best treatment. How is this treated? Treatment for this condition depends on the severity of the condition:  Mild impetigo can be treated with prescription antibiotic cream.  Oral antibiotic medicine may be used in more severe cases.  Medicines that reduce itchiness (antihistamines)may also be used. Follow these instructions at home: Medicines  Take over-the-counter and prescription medicines only as told by your health care provider.  Apply or take your antibiotic as told by your health care provider. Do not stop using the antibiotic even if your condition improves.  Before applying antibiotic cream or ointment, you should: ? Gently wash the infected areas with antibacterial soap and warm water. ? Soak crusted areas in warm, soapy water using antibacterial soap. ? Gently rub the areas to remove crusts. Do not scrub. Preventing the spread of infection  To help prevent impetigo from spreading to other body areas: ? Keep your fingernails short and clean. ? Do not scratch the blisters or sores. ? Cover infected areas, if necessary, to keep from scratching. ? Wash your hands often with  soap and warm water.  To help prevent impetigo from spreading to other people: ? Do not share towels. ? Wash your clothing and bedsheets in water that is 140F (60C) or warmer. ? Stay home until you have used an antibiotic cream for 48 hours (2 days) or an oral antibiotic medicine for 24 hours (1 day).  You should only return to work and activities with other people if your skin shows significant improvement.  You may return to contact sports after you have used antibiotic medicine for 72 hours (3 days).   General instructions  Keep all follow-up visits. This is important. How is this prevented?  Wash your hands often with soap and warm water.  Do not share towels, washcloths, clothing, bedding, or razors.  Keep your fingernails short.  Keep any cuts, scrapes, bug bites,  or rashes clean and covered.  Use insect repellent to prevent bug bites. Contact a health care provider if:  You develop more blisters or sores, even with treatment.  Other family members get sores.  Your skin sores are not improving after 72 hours (3 days) of treatment.  You have a fever. Get help right away if:  You see spreading redness or swelling of the skin around your sores.  You develop a sore throat.  The area around your rash becomes warm, red, or tender to the touch.  You have dark, reddish-brown urine.  You do not urinate often or you urinate small amounts.  You are very tired (lethargic).  You have swelling in your face, hands, or feet. Summary  Impetigo is a skin infection that causes itchy blisters and sores that produce brownish-yellow fluid. As the fluid dries, it forms a crust.  This condition is caused by staphylococci or streptococci bacteria. These bacteria cause impetigo when they get under the surface of the skin, such as through cuts, rashes, bug bites, or open sores.  Treatment for this condition may include antibiotic ointment or oral antibiotics.  To help prevent  impetigo from spreading to other body areas, make sure you keep your fingernails short, avoid scratching, cover any blisters, and wash your hands often.  If you have impetigo, stay home until you have used an antibiotic cream for 48 hours (2 days) or an oral antibiotic medicine for 24 hours (1 day). You should only return to work and activities with other people if your skin shows significant improvement. This information is not intended to replace advice given to you by your health care provider. Make sure you discuss any questions you have with your health care provider. Document Revised: 12/28/2019 Document Reviewed: 12/28/2019 Elsevier Patient Education  2021 [place].  Bacterial Conjunctivitis, Adult Bacterial conjunctivitis is an infection of your conjunctiva. This is the clear membrane that covers the white part of your eye and the inner part of your eyelid. This infection can make your eye:  Red or pink.  Itchy. This condition spreads easily from person to person (is contagious) and from one eye to the other eye. What are the causes?  This condition is caused by germs (bacteria). You may get the infection if you come into close contact with: ? A person who has the infection. ? Items that have germs on them (are contaminated), such as face towels, contact lens solution, or eye makeup. What increases the risk? You are more likely to get this condition if you:  Have contact with people who have the infection.  Wear contact lenses.  Have a sinus infection.  Have had a recent eye injury or surgery.  Have a weak body defense system (immune system).  Have dry eyes. What are the signs or symptoms?  Thick, yellowish discharge from the eye.  Tearing or watery eyes.  Itchy eyes.  Burning feeling in your eyes.  Eye redness.  Swollen eyelids.  Blurred vision.   How is this treated?  Antibiotic eye drops or ointment.  Antibiotic medicine taken by mouth. This is used  for infections that do not get better with drops or ointment or that last more than 10 days.  Cool, wet cloths placed on the eyes.  Artificial tears used 2-6 times a day.   Follow these instructions at home: Medicines  Take or apply your antibiotic medicine as told by your doctor. Do not stop taking or applying the antibiotic even if  you start to feel better.  Take or apply over-the-counter and prescription medicines only as told by your doctor.  Do not touch your eyelid with the eye-drop bottle or the ointment tube. Managing discomfort  Wipe any fluid from your eye with a warm, wet washcloth or a cotton ball.  Place a clean, cool, wet cloth on your eye. Do this for 10-20 minutes, 3-4 times per day. General instructions  Do not wear contacts until the infection is gone. Wear glasses until your doctor says it is okay to wear contacts again.  Do not wear eye makeup until the infection is gone. Throw away old eye makeup.  Change or wash your pillowcase every day.  Do not share towels or washcloths.  Wash your hands often with soap and water. Use paper towels to dry your hands.  Do not touch or rub your eyes.  Do not drive or use heavy machinery if your vision is blurred. Contact a doctor if:  You have a fever.  You do not get better after 10 days. Get help right away if:  You have a fever and your symptoms get worse all of a sudden.  You have very bad pain when you move your eye.  Your face: ? Hurts. ? Is red. ? Is swollen.  You have sudden loss of vision. Summary  Bacterial conjunctivitis is an infection of your conjunctiva.  This infection spreads easily from person to person.  Wash your hands often with soap and water. Use paper towels to dry your hands.  Take or apply your antibiotic medicine as told by your doctor.  Contact a doctor if you have a fever or you do not get better after 10 days. This information is not intended to replace advice given to you  by your health care provider. Make sure you discuss any questions you have with your health care provider. Document Revised: 06/19/2020 Document Reviewed: 03/03/2018 Elsevier Patient Education  [place].

## 2021-01-02 NOTE — Progress Notes (Signed)
Subjective:     Brandi Sutton is a 45 y.o. female and is here for a comprehensive physical exam. Pt with burning, bump on corner of bottom L lip.  Initially thought 2/2 cold sore but OTC cold sore medication did not help.  Patient concerned she spread poison ivy from her finger and posterior left leg to her face once the bump on her lip began to spread.  Patient can contact with poison ivy 1+ month ago while working in her mother-in-law's yard.  Patient also with a left thigh erythema and irritation states at times itchy has a history of allergies but is not taking anything as medication tends to make her sleepy.  Will forget to take OTC allergy medicine in the evening.  Is to schedule mammogram as last done in 2019.  History of partial hysterectomy in 2020.  Previously followed by OB/GYN, however her provider retired.  Patient taking B12 Gummies.  Notes improvement in GI symptoms with increase in Levsin SL dose.  Followed by GI.  Patient inquires about autoimmune testing as she notes a history of increased scar tissue after procedures.  Denies family history of autoimmune disorder or other symptoms.  Social History   Socioeconomic History  . Marital status: Married    Spouse name: Not on file  . Number of children: 1  . Years of education: Not on file  . Highest education level: Not on file  Occupational History  . Occupation: Chartered certified accountant  Tobacco Use  . Smoking status: Former Smoker    Types: Cigarettes    Quit date: 12/09/2005    Years since quitting: 15.0  . Smokeless tobacco: Never Used  Vaping Use  . Vaping Use: Never used  Substance and Sexual Activity  . Alcohol use: Yes    Alcohol/week: 0.0 standard drinks    Comment: 1 beers per week liquor 1x week   . Drug use: No  . Sexual activity: Yes    Birth control/protection: None  Other Topics Concern  . Not on file  Social History Narrative  . Not on file   Social Determinants of Health   Financial Resource Strain: Not  on file  Food Insecurity: Not on file  Transportation Needs: Not on file  Physical Activity: Not on file  Stress: Not on file  Social Connections: Not on file  Intimate Partner Violence: Not on file   Health Maintenance  Topic Date Due  . COVID-19 Vaccine (1) Never done  . HIV Screening  Never done  . Hepatitis C Screening  Never done  . PAP SMEAR-Modifier  05/07/2019  . INFLUENZA VACCINE  03/11/2021  . COLONOSCOPY (Pts 45-69yrs Insurance coverage will need to be confirmed)  06/20/2021  . TETANUS/TDAP  03/04/2027  . HPV VACCINES  Aged Out   Family History  Problem Relation Age of Onset  . Depression Mother   . Diabetes Mother        diet controlled  . Transient ischemic attack Mother   . Colon polyps Mother        pre-cancerous  . Hypertension Father   . Diabetes Father        diet controlled  . Bowel Disease Father        blockage, had to wear a bag for awhile  . Lung cancer Paternal Grandfather   . Stroke Maternal Grandfather     The following portions of the patient's history were reviewed and updated as appropriate: allergies, current medications, past family history, past medical  history, past social history, past surgical history and problem list.  Review of Systems Pertinent items noted in HPI and remainder of comprehensive ROS otherwise negative.   Objective:    BP 122/86 (BP Location: Right Arm, Patient Position: Sitting, Cuff Size: Normal)   Pulse 68   Temp 98.2 F (36.8 C) (Oral)   Ht 5\' 6"  (1.676 m)   Wt 179 lb 12.8 oz (81.6 kg)   LMP 03/12/2011   SpO2 99%   BMI 29.02 kg/m  General appearance: alert, cooperative and no distress Head: Normocephalic, without obvious abnormality, atraumatic Eyes: Right conjunctival injection with crusting in corner of medial eye.  Left conjunctivae/cornea clear. PERRL, EOM's intact. Fundi benign. Ears: normal TM's and external ear canals both ears Nose: Nares normal. Septum midline. Mucosa normal. No drainage or sinus  tenderness. Throat: lips, mucosa, and tongue normal; teeth and gums normal Neck: no adenopathy, no carotid bruit, no JVD, supple, symmetrical, trachea midline and thyroid not enlarged, symmetric, no tenderness/mass/nodules Lungs: clear to auscultation bilaterally Heart: regular rate and rhythm, S1, S2 normal, no murmur, click, rub or gallop Abdomen: soft, non-tender; bowel sounds normal; no masses,  no organomegaly Extremities: extremities normal, atraumatic, no cyanosis or edema Pulses: 2+ and symmetric Skin: Warm, dry, intact, normal turgor.  Dried, honey colored crusted lesion on lateral left lower lip with a few pinpoint lesions medial Lymph nodes: Cervical, supraclavicular, and axillary nodes normal. Neurologic: Alert and oriented X 3, normal strength and tone. Normal symmetric reflexes. Normal coordination and gait    Assessment:    Healthy female exam with conjunctivitis and impetigo-like lesions on left lower lip.     Plan:     Anticipatory guidance given including wearing seatbelts, smoke detectors in the home, increasing physical activity, increasing p.o. intake of water and vegetables. -will obtain labs -Patient to schedule mammogram -s/p partial hysterectomy in 2020 -Colonoscopy done 06/20/2016 -PHQ-9 score 5 -GAD-7 score 4 -Given handout -Next CPE in 1 year See After Visit Summary for Counseling Recommendations    Impetigo  - Plan: mupirocin cream (BACTROBAN) 2 %  Acute bacterial conjunctivitis of right eye -Warm compresses several times per day -Frequent handwashing -Control of allergy symptoms - Plan: ofloxacin (OCUFLOX) 0.3 % ophthalmic solution  Gastroesophageal reflux disease, unspecified whether esophagitis present  - Plan: Basic metabolic panel, TSH, T4, Free, Vitamin B12  Screening for lipid disorders  - Plan: Lipid panel  Seasonal allergies -Flonase as needed -Advised to use OTC antihistamine nightly to avoid daytime drowsiness - Plan: CBC with  Differential/Platelet  Encounter for hepatitis C screening test for low risk patient  - Plan: Hep C Antibody  F/u prn  Grier Mitts, MD

## 2021-01-03 LAB — HEPATITIS C ANTIBODY
Hepatitis C Ab: NONREACTIVE
SIGNAL TO CUT-OFF: 0.01 (ref <1.00)

## 2021-01-04 NOTE — Progress Notes (Signed)
Results viewed on MyChart. 

## 2021-01-10 ENCOUNTER — Other Ambulatory Visit: Payer: Self-pay

## 2021-01-10 MED ORDER — MUPIROCIN 2 % EX OINT: 1.0000 "application " | TOPICAL_OINTMENT | Freq: Two times a day (BID) | CUTANEOUS | 0 refills | Status: DC

## 2021-01-10 NOTE — Progress Notes (Signed)
Results viewed on MyChart. 

## 2021-01-25 ENCOUNTER — Other Ambulatory Visit: Payer: Self-pay

## 2021-01-28 ENCOUNTER — Ambulatory Visit: Payer: No Typology Code available for payment source | Admitting: Family Medicine

## 2021-01-28 ENCOUNTER — Encounter: Payer: Self-pay | Admitting: Family Medicine

## 2021-01-28 ENCOUNTER — Other Ambulatory Visit: Payer: Self-pay

## 2021-01-28 VITALS — BP 116/70 | HR 78 | Temp 98.1°F | Wt 183.6 lb

## 2021-01-28 DIAGNOSIS — R202 Paresthesia of skin: Secondary | ICD-10-CM

## 2021-01-28 DIAGNOSIS — R600 Localized edema: Secondary | ICD-10-CM | POA: Diagnosis not present

## 2021-01-28 DIAGNOSIS — R200 Anesthesia of skin: Secondary | ICD-10-CM

## 2021-01-28 DIAGNOSIS — E538 Deficiency of other specified B group vitamins: Secondary | ICD-10-CM

## 2021-01-28 MED ORDER — CYANOCOBALAMIN 1000 MCG/ML IJ SOLN
1000.0000 ug | Freq: Once | INTRAMUSCULAR | Status: AC
  Administered 2021-01-28: 1000 ug via INTRAMUSCULAR

## 2021-01-28 NOTE — Progress Notes (Signed)
Subjective:    Patient ID: Brandi Sutton, female    DOB: 05-11-1976, 45 y.o.   MRN: [id number]  Chief Complaint  Patient presents with   Leg Pain    For almost 3 weeks has had constant 'pins and needles' feeling in both legs and feet.  Also has some swelling.    HPI Patient was seen today for ongoing concern of pins and needles sensation in b/l feet and legs x 3 wks.  First noticed sensation in L great toe was decreased while getting a pedicure. Pt also notes LE edema, worse at the end of the day.  Denies increased sodium intake or increased sitting or standing. Pt also notes feeling a "knot" in RLE at medial shin, non-tender.  Cannot see the area, but noticed with deep palpation.  Past Medical History:  Diagnosis Date   Allergy    ASTHMA 03/31/2007   no attack in years per patient    Asthma    allergy related   Breast mass, left    CONTACT DERMATITIS&OTHER ECZEMA DUE TO PLANTS 12/03/2009   Depression    DYSFUNCTIONAL UTERINE BLEEDING 01/24/2010   GERD (gastroesophageal reflux disease)    Headache    sinus headaches    HIP PAIN, RIGHT 01/22/2009   LOW BACK PAIN, ACUTE 04/17/2008    Allergies  Allergen Reactions   Penicillins Hives and Itching    Has patient had a PCN reaction causing immediate rash, facial/tongue/throat swelling, SOB or lightheadedness with hypotension: no Has patient had a PCN reaction causing severe rash involving mucus membranes or skin necrosis: no Has patient had a PCN reaction that required hospitalization no Has patient had a PCN reaction occurring within the last 10 years: yes If all of the above answers are "NO", then may proceed with Cephalosporin use.    Bupropion Hcl Hives and Rash   Sulfa Antibiotics Rash    ROS General: Denies fever, chills, night sweats, changes in weight, changes in appetite HEENT: Denies headaches, ear pain, changes in vision, rhinorrhea, sore throat CV: Denies CP, palpitations, SOB, orthopnea +b/l ankle edema Pulm: Denies  SOB, cough, wheezing GI: Denies abdominal pain, nausea, vomiting, diarrhea, constipation GU: Denies dysuria, hematuria, frequency, vaginal discharge Msk: Denies muscle cramps, joint pains Neuro: Denies weakness +numbness, tingling in LEs Skin: Denies rashes + bruising  Psych: Denies depression, anxiety, hallucinations    Objective:    Blood pressure 116/70, pulse 78, temperature 98.1 F (36.7 C), temperature source Oral, weight 183 lb 9.6 oz (83.3 kg), last menstrual period 03/12/2011, SpO2 98 %.   Gen. Pleasant, well-nourished, in no distress, normal affect   HEENT: [place]/AT, face symmetric, conjunctiva clear, no scleral icterus, PERRLA, EOMI, nares patent without drainage Lungs: no accessory muscle use, CTAB, no wheezes or rales Cardiovascular: RRR, no m/r/g, trace peripheral edema in b/l LEs to shin Musculoskeletal: No deformities, no cyanosis or clubbing, normal tone Neuro:  A&Ox3, CN II-XII intact, normal gait.  Mildly decreased sensation to pinprick L>R.  Normal sensation to soft touch.  Normal vibratory sense in LEs.  Skin:  Warm, no lesions/ rash   Wt Readings from Last 3 Encounters:  01/28/21 183 lb 9.6 oz (83.3 kg)  01/02/21 179 lb 12.8 oz (81.6 kg)  11/07/20 176 lb 6.4 oz (80 kg)    Lab Results  Component Value Date   WBC 5.1 01/02/2021   HGB 14.2 01/02/2021   HCT 41.8 01/02/2021   PLT 237.0 01/02/2021   GLUCOSE 87 01/02/2021   CHOL 186  01/02/2021   TRIG 102.0 01/02/2021   HDL 55.30 01/02/2021   LDLCALC 110 (H) 01/02/2021   ALT 46 (H) 09/06/2020   AST 29 09/06/2020   NA 141 01/02/2021   K 4.1 01/02/2021   CL 105 01/02/2021   CREATININE 0.72 01/02/2021   BUN 10 01/02/2021   CO2 29 01/02/2021   TSH 1.61 01/02/2021   HGBA1C 5.3 01/02/2021    Assessment/Plan:  Numbness and tingling of both lower extremities -Discussed possible causes including vitamin deficiency, nerve compression, electrolyte abnormality, elevated blood sugar, PVD -B12 low normal at 380 on  01/02/21 despite taking po supplement -will give B12 injection this visit and recheck level in 1 month -labs from 01/02/21 reviewed including lipid panel largely normal other than elevated LDL at 110 -will also obtain labs to recheck CMP, CBC, and folate. -Given handout - Plan: CMP, CBC with Differential/Platelet, Vitamin B12, cyanocobalamin ((VITAMIN B-12)) injection 1,000 mcg, Folate  Bilateral leg edema  -suportive care including elevating LEs, compression socks, TED hose -Discussed decreasing sodium intake -Pending labs -For continued or worsening symptoms vascular consult -Given handout - Plan: CMP, Brain Natriuretic Peptide  Vitamin B 12 deficiency -Vitamin B12 low normal at 380 on 01/02/2021 despite p.o. supplement -We will give vitamin B12 IM this visit -Recheck vitamin B12 level in 1 month -Given handout - Plan: Vitamin B12, cyanocobalamin ((VITAMIN B-12)) injection 1,000 mcg  F/u in 1 month  Grier Mitts, MD

## 2021-02-14 ENCOUNTER — Encounter: Payer: Self-pay | Admitting: Family Medicine

## 2021-02-18 ENCOUNTER — Ambulatory Visit: Payer: No Typology Code available for payment source | Admitting: Family Medicine

## 2021-02-18 ENCOUNTER — Other Ambulatory Visit: Payer: Self-pay

## 2021-02-18 VITALS — BP 132/84 | HR 78 | Temp 98.1°F | Wt 186.6 lb

## 2021-02-18 DIAGNOSIS — R29898 Other symptoms and signs involving the musculoskeletal system: Secondary | ICD-10-CM

## 2021-02-18 DIAGNOSIS — R202 Paresthesia of skin: Secondary | ICD-10-CM | POA: Diagnosis not present

## 2021-02-18 DIAGNOSIS — E538 Deficiency of other specified B group vitamins: Secondary | ICD-10-CM

## 2021-02-18 DIAGNOSIS — R600 Localized edema: Secondary | ICD-10-CM | POA: Diagnosis not present

## 2021-02-18 DIAGNOSIS — R5383 Other fatigue: Secondary | ICD-10-CM

## 2021-02-18 DIAGNOSIS — R200 Anesthesia of skin: Secondary | ICD-10-CM | POA: Diagnosis not present

## 2021-02-18 LAB — CBC WITH DIFFERENTIAL/PLATELET
Basophils Absolute: 0.1 10*3/uL (ref 0.0–0.1)
Basophils Relative: 0.8 % (ref 0.0–3.0)
Eosinophils Absolute: 0.2 10*3/uL (ref 0.0–0.7)
Eosinophils Relative: 4.1 % (ref 0.0–5.0)
HCT: 40.5 % (ref 36.0–46.0)
Hemoglobin: 13.8 g/dL (ref 12.0–15.0)
Lymphocytes Relative: 25.7 % (ref 12.0–46.0)
Lymphs Abs: 1.5 10*3/uL (ref 0.7–4.0)
MCHC: 34 g/dL (ref 30.0–36.0)
MCV: 90.6 fl (ref 78.0–100.0)
Monocytes Absolute: 0.4 10*3/uL (ref 0.1–1.0)
Monocytes Relative: 6.2 % (ref 3.0–12.0)
Neutro Abs: 3.8 10*3/uL (ref 1.4–7.7)
Neutrophils Relative %: 63.2 % (ref 43.0–77.0)
Platelets: 227 10*3/uL (ref 150.0–400.0)
RBC: 4.48 Mil/uL (ref 3.87–5.11)
RDW: 13.3 % (ref 11.5–15.5)
WBC: 6 10*3/uL (ref 4.0–10.5)

## 2021-02-18 LAB — COMPREHENSIVE METABOLIC PANEL
ALT: 21 U/L (ref 0–35)
AST: 21 U/L (ref 0–37)
Albumin: 4.3 g/dL (ref 3.5–5.2)
Alkaline Phosphatase: 60 U/L (ref 39–117)
BUN: 12 mg/dL (ref 6–23)
CO2: 26 mEq/L (ref 19–32)
Calcium: 9 mg/dL (ref 8.4–10.5)
Chloride: 103 mEq/L (ref 96–112)
Creatinine, Ser: 0.84 mg/dL (ref 0.40–1.20)
GFR: 82.69 mL/min (ref >60.00)
Glucose, Bld: 84 mg/dL (ref 70–99)
Potassium: 4.1 mEq/L (ref 3.5–5.1)
Sodium: 140 mEq/L (ref 135–145)
Total Bilirubin: 0.4 mg/dL (ref 0.2–1.2)
Total Protein: 6.4 g/dL (ref 6.0–8.3)

## 2021-02-18 LAB — SEDIMENTATION RATE: Sed Rate: 8 mm/hr (ref 0–20)

## 2021-02-18 LAB — TSH: TSH: 1.65 u[IU]/mL (ref 0.35–5.50)

## 2021-02-18 LAB — FOLATE
Folate: 18.6 ng/mL (ref >5.9)
Folate: 20.8 ng/mL (ref >5.9)

## 2021-02-18 LAB — T4, FREE: Free T4: 0.8 ng/dL (ref 0.60–1.60)

## 2021-02-18 LAB — BRAIN NATRIURETIC PEPTIDE: Pro B Natriuretic peptide (BNP): 61 pg/mL (ref 0.0–100.0)

## 2021-02-18 LAB — VITAMIN B12: Vitamin B-12: 355 pg/mL (ref 211–911)

## 2021-02-18 NOTE — Patient Instructions (Signed)
The orders for the MRI will be placed.  They will contact you about scheduling the appt.

## 2021-02-19 NOTE — Progress Notes (Signed)
Subjective:    Patient ID: Brandi Sutton, female    DOB: 1976/01/20, 45 y.o.   MRN: [id number]  Chief Complaint  Patient presents with   Numbness    The tingling has gotten worse, now has weakness in arms, right leg pain causing limp, pain in right big toe, face is tingling, ears will tingle and go numb with mask. Sometimes wakes up in middle of night and extremities are numb, limbs are stiff in the mornings    HPI Patient is a 45 year old female with pmh sig for seasonal allergies, GERD, arthritis, former smoker, asthma, IBS who was seen today for f/u on on acute concern.  Pt seen 01/28/21 for neuropathy of b/l LEs.  Labs normal.  Did not notice a difference with vitamin B12 injection.  Pt with progressing symptoms.  Neuropathy in UEs, posterior neck moving up head and to face.  Pt endorses pain in R great toe with applying pressure to RLE.  Now with weakness in arms.  Notes weight gain, was 175 in April, now 186.  Denies SOB, CP, rash, fever, changes in foods, or meds.  Mom has a h/o psoriasis and dad suspected Parkinson's disease.  Pt has an organic garden at home.  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  The chicken coop is on the other side of her property with the run off being no where near the garden.  Past Medical History:  Diagnosis Date   Allergy    ASTHMA 03/31/2007   no attack in years per patient    Asthma    allergy related   Breast mass, left    CONTACT DERMATITIS&OTHER ECZEMA DUE TO PLANTS 12/03/2009   Depression    DYSFUNCTIONAL UTERINE BLEEDING 01/24/2010   GERD (gastroesophageal reflux disease)    Headache    sinus headaches    HIP PAIN, RIGHT 01/22/2009   LOW BACK PAIN, ACUTE 04/17/2008    Allergies  Allergen Reactions   Penicillins Hives and Itching    Has patient had a PCN reaction causing immediate rash, facial/tongue/throat swelling, SOB or lightheadedness with hypotension: no Has patient had a PCN reaction causing severe rash involving mucus membranes  or skin necrosis: no Has patient had a PCN reaction that required hospitalization no Has patient had a PCN reaction occurring within the last 10 years: yes If all of the above answers are "NO", then may proceed with Cephalosporin use.    Bupropion Hcl Hives and Rash   Sulfa Antibiotics Rash   Family History  Problem Relation Age of Onset   Depression Mother    Diabetes Mother        diet controlled   Transient ischemic attack Mother    Colon polyps Mother        pre-cancerous   Hypertension Father    Diabetes Father        diet controlled   Bowel Disease Father        blockage, had to wear a bag for awhile   Lung cancer Paternal Grandfather    Stroke Maternal Grandfather      ROS General: Denies fever, chills, night sweats,changes in appetite +change in weight, chills, fatigue HEENT: Denies headaches, ear pain, changes in vision, rhinorrhea, sore throat CV: Denies CP, palpitations, SOB, orthopnea Pulm: Denies SOB, cough, wheezing GI: Denies abdominal pain, nausea, vomiting, diarrhea, constipation GU: Denies dysuria, hematuria, frequency, vaginal discharge Msk: Denies muscle cramps, joint pains Neuro:  + weakness, numbness, tingling in b/l LEs, b/l UEs,  shoulders, neck, head and face.  Skin: Denies rashes, bruising Psych: Denies depression, anxiety, hallucinations    Objective:    Blood pressure 132/84, pulse 78, temperature 98.1 F (36.7 C), temperature source Oral, weight 186 lb 9.6 oz (84.6 kg), last menstrual period 03/12/2011, SpO2 97 %.  Gen. Pleasant, well-nourished, in no distress, normal affect   HEENT: [place]/AT, face symmetric, conjunctiva clear, no scleral icterus, PERRLA, EOMI, nares patent without drainage, pharynx without erythema, tongue midline, no thyromegaly Lungs: no accessory muscle use, CTAB, no wheezes or rales Cardiovascular: RRR, no m/r/g, no peripheral edema Musculoskeletal: Mildly decreased strength in bilateral LEs.  No TTP of cervical, thoracic,  or lumbar spine.  No deformities, no cyanosis or clubbing, normal tone.   Neuro:  A&Ox3, CN II-XII intact, normal gait.  Symmetric patellar, Achilles, radial, and bicep reflexes b/l.  Slightly decreased sensation to sharp and soft on anterior lower leg bilaterally. Skin:  Warm, no lesions/ rash   Wt Readings from Last 3 Encounters:  02/18/21 186 lb 9.6 oz (84.6 kg)  01/28/21 183 lb 9.6 oz (83.3 kg)  01/02/21 179 lb 12.8 oz (81.6 kg)    Lab Results  Component Value Date   WBC 6.0 02/18/2021   HGB 13.8 02/18/2021   HCT 40.5 02/18/2021   PLT 227.0 02/18/2021   GLUCOSE 84 02/18/2021   CHOL 186 01/02/2021   TRIG 102.0 01/02/2021   HDL 55.30 01/02/2021   LDLCALC 110 (H) 01/02/2021   ALT 21 02/18/2021   AST 21 02/18/2021   NA 140 02/18/2021   K 4.1 02/18/2021   CL 103 02/18/2021   CREATININE 0.84 02/18/2021   BUN 12 02/18/2021   CO2 26 02/18/2021   TSH 1.65 02/18/2021   HGBA1C 5.3 01/02/2021    Assessment/Plan:  Numbness and tingling of both upper extremities  - Plan: TSH, T4, Free, Folate, CBC with Differential/Platelet, Sedimentation Rate, Ambulatory referral to Neurology  Numbness and tingling of both lower extremities  - Plan: TSH, T4, Free, Folate, CBC with Differential/Platelet, Sedimentation Rate, Ambulatory referral to Neurology, Folate, Vitamin B12, CBC with Differential/Platelet, CMP  Weakness of both legs  - Plan: TSH, T4, Free, Folate, CBC with Differential/Platelet, Sedimentation Rate, Ambulatory referral to Neurology, Lupus (SLE) Analysis  Fatigue, unspecified type  - Plan: TSH, T4, Free, Folate, CBC with Differential/Platelet, Sedimentation Rate, Ambulatory referral to Neurology, Lupus (SLE) Analysis  Bilateral leg edema  - Plan: TSH, CBC with Differential/Platelet, Sedimentation Rate, Ambulatory referral to Neurology, Lupus (SLE) Analysis, Brain Natriuretic Peptide, CMP  Vitamin B 12 deficiency  - Plan: Vitamin B12  Labs including TSH, free T4, Hep C  screen, CBC with diff,  vitamin B12, and BMP, and lipid panel normal with exception of mildly elevated LDL (110).  Discussed possible causes of ascending neuropathy.  Given continued symptoms will repeat labs and obtain imaging to evaluate for MS vs other myelopathy.  MRI brain and total spine ordered.  Referral to Neurology placed.  Given strict precautions.  F/u prn  Grier Mitts, MD

## 2021-02-22 ENCOUNTER — Telehealth: Payer: Self-pay | Admitting: Family Medicine

## 2021-02-22 NOTE — Telephone Encounter (Signed)
Patient called and stated that Dr. Volanda Napoleon put in a referral for her for a neurologist. She spoke with the neurology office and was told they the referral was sent back because they needed more information.  Please advise.

## 2021-02-25 ENCOUNTER — Encounter: Payer: Self-pay | Admitting: Family Medicine

## 2021-02-25 ENCOUNTER — Other Ambulatory Visit: Payer: No Typology Code available for payment source

## 2021-02-26 ENCOUNTER — Encounter: Payer: Self-pay | Admitting: Neurology

## 2021-02-26 NOTE — Telephone Encounter (Signed)
Can you look into this?  Thanks. 

## 2021-02-27 ENCOUNTER — Ambulatory Visit: Payer: No Typology Code available for payment source

## 2021-02-27 ENCOUNTER — Other Ambulatory Visit: Payer: No Typology Code available for payment source

## 2021-03-06 ENCOUNTER — Encounter: Payer: Self-pay | Admitting: Family Medicine

## 2021-03-06 ENCOUNTER — Other Ambulatory Visit: Payer: No Typology Code available for payment source

## 2021-03-08 NOTE — Telephone Encounter (Signed)
Up her back, down her legs, tried Tylenol, ibuprofen and nothing is helping. Can't sleep the neuropathy is getting really painful. Pins and needle feeling during the day.  She would like some advise.

## 2021-03-11 ENCOUNTER — Telehealth: Payer: Self-pay

## 2021-03-11 DIAGNOSIS — R29898 Other symptoms and signs involving the musculoskeletal system: Secondary | ICD-10-CM

## 2021-03-11 DIAGNOSIS — R200 Anesthesia of skin: Secondary | ICD-10-CM

## 2021-03-11 DIAGNOSIS — R202 Paresthesia of skin: Secondary | ICD-10-CM

## 2021-03-11 MED ORDER — GABAPENTIN 100 MG PO CAPS: 100.0000 mg | ORAL_CAPSULE | Freq: Three times a day (TID) | ORAL | 1 refills | Status: DC

## 2021-03-11 NOTE — Telephone Encounter (Signed)
PT called to followup on the meds that the Dr stated she could call in. She also stated that the neurologist is unable to move her apt up from Sept but she is on a waiting list.

## 2021-03-11 NOTE — Telephone Encounter (Signed)
[facility] called and requesting 3 separate orders to be put in (chest, abdomen, thorax)

## 2021-03-12 ENCOUNTER — Encounter: Payer: Self-pay | Admitting: Family Medicine

## 2021-03-12 NOTE — Telephone Encounter (Signed)
Orders placed.

## 2021-03-13 ENCOUNTER — Encounter: Payer: Self-pay | Admitting: Family Medicine

## 2021-03-19 ENCOUNTER — Other Ambulatory Visit: Payer: Self-pay

## 2021-03-19 ENCOUNTER — Ambulatory Visit
Admission: RE | Admit: 2021-03-19 | Discharge: 2021-03-19 | Disposition: A | Discharge status: Home / Self Care | Payer: No Typology Code available for payment source | Source: Ambulatory Visit | Attending: Family Medicine | Admitting: Family Medicine

## 2021-03-19 DIAGNOSIS — R200 Anesthesia of skin: Secondary | ICD-10-CM

## 2021-03-19 DIAGNOSIS — R29898 Other symptoms and signs involving the musculoskeletal system: Secondary | ICD-10-CM

## 2021-03-19 DIAGNOSIS — R202 Paresthesia of skin: Secondary | ICD-10-CM

## 2021-03-19 MED ORDER — HYOSCYAMINE SULFATE 0.125 MG SL SUBL: 0.1250 mg | SUBLINGUAL_TABLET | Freq: Four times a day (QID) | SUBLINGUAL | 0 refills | Status: DC | PRN

## 2021-03-19 MED ORDER — GADOBENATE DIMEGLUMINE 529 MG/ML IV SOLN
17.0000 mL | Freq: Once | INTRAVENOUS | Status: AC | PRN
  Administered 2021-03-19: 17 mL via INTRAVENOUS

## 2021-03-20 ENCOUNTER — Encounter: Payer: Self-pay | Admitting: Family Medicine

## 2021-03-20 ENCOUNTER — Telehealth: Payer: No Typology Code available for payment source | Admitting: Family Medicine

## 2021-03-20 DIAGNOSIS — R200 Anesthesia of skin: Secondary | ICD-10-CM | POA: Diagnosis not present

## 2021-03-20 DIAGNOSIS — R202 Paresthesia of skin: Secondary | ICD-10-CM

## 2021-03-20 DIAGNOSIS — F40240 Claustrophobia: Secondary | ICD-10-CM | POA: Diagnosis not present

## 2021-03-20 NOTE — Progress Notes (Signed)
Virtual Visit via Video Note  I connected with Brandi Sutton on 03/20/21 at  4:30 PM EDT by a video enabled telemedicine application 2/2 [id number] pandemic and verified that I am speaking with the correct person using two identifiers.  Location patient: home Location provider:work or home office Persons participating in the virtual visit: patient, provider, and patient's husband  I discussed the limitations of evaluation and management by telemedicine and the availability of in person appointments. The patient expressed understanding and agreed to proceed.   HPI: Patient seen for follow-up on ongoing paresthesias of upper and lower extremities as well as MRI results.  Patient notes gabapentin 100 mg nightly has not been effective.   ROS: See pertinent positives and negatives per HPI.  Past Medical History:  Diagnosis Date   Allergy    ASTHMA 03/31/2007   no attack in years per patient    Asthma    allergy related   Breast mass, left    CONTACT DERMATITIS&OTHER ECZEMA DUE TO PLANTS 12/03/2009   Depression    DYSFUNCTIONAL UTERINE BLEEDING 01/24/2010   GERD (gastroesophageal reflux disease)    Headache    sinus headaches    HIP PAIN, RIGHT 01/22/2009   LOW BACK PAIN, ACUTE 04/17/2008    Past Surgical History:  Procedure Laterality Date   BREAST EXCISIONAL BIOPSY Left 2017   CHOLECYSTECTOMY N/A 12/25/2015   Procedure: LAPAROSCOPIC CHOLECYSTECTOMY;  Surgeon: Rolm Bookbinder, MD;  Location: [facility];  Service: General;  Laterality: N/A;   LAPAROSCOPIC VAGINAL HYSTERECTOMY WITH SALPINGECTOMY Bilateral 10/14/2018   Procedure: LAPAROSCOPIC ASSISTED VAGINAL HYSTERECTOMY WITH SALPINGECTOMY;  Surgeon: Olga Millers, MD;  [facility];  Service: Gynecology;  Laterality: Bilateral;   RADIOACTIVE SEED GUIDED EXCISIONAL BREAST BIOPSY Left 07/14/2016   Procedure: RADIOACTIVE SEED GUIDED EXCISIONAL BREAST BIOPSY;  Surgeon: Rolm Bookbinder, MD;  Location: [place];  Service: General;  Laterality: Left;   TONSILLECTOMY AND ADENOIDECTOMY     uterine abalation     WISDOM TOOTH EXTRACTION      Family History  Problem Relation Age of Onset   Depression Mother    Diabetes Mother        diet controlled   Transient ischemic attack Mother    Colon polyps Mother        pre-cancerous   Hypertension Father    Diabetes Father        diet controlled   Bowel Disease Father        blockage, had to wear a bag for awhile   Lung cancer Paternal Grandfather    Stroke Maternal Grandfather      Current Outpatient Medications:    diclofenac (VOLTAREN) 75 MG EC tablet, Take 75 mg by mouth 2 (two) times daily., Disp: , Rfl:    gabapentin (NEURONTIN) 100 MG capsule, Take 1 capsule (100 mg total) by mouth 3 (three) times daily., Disp: 90 capsule, Rfl: 1   hyoscyamine (LEVSIN SL) 0.125 MG SL tablet, Place 1 tablet (0.125 mg total) under the tongue every 6 (six) hours as needed., Disp: 30 tablet, Rfl: 0   Melatonin 2.5 MG CHEW, Chew 2 each by mouth at bedtime. As needed- gummies for sleep, Disp: , Rfl:   EXAM:  VITALS per patient if applicable: RR between 46-50 bpm  GENERAL: alert, oriented, appears well and in no acute distress  HEENT: atraumatic, conjunctiva clear, no obvious abnormalities on inspection of external nose and ears  NECK: normal movements of the head and neck  LUNGS: on inspection no signs of respiratory distress, breathing rate appears normal, no obvious gross SOB, gasping or wheezing  CV: no obvious cyanosis  MS: moves all visible extremities without noticeable abnormality  PSYCH/NEURO: pleasant and cooperative, no obvious depression or anxiety, speech and thought processing grossly intact  ASSESSMENT AND PLAN:  Discussed the following assessment and plan:  Numbness and tingling of both upper extremities  Numbness and tingling of both lower extremities  Claustrophobia - Plan: diazepam (VALIUM) 5 MG tablet  Results of MRI  brain with without contrast from 03/19/2021 reviewed with patient and her husband.  No acute intracranial abnormality.  Few scattered punctate changes noted within bilateral cerebral white matter comparable to sequela of nonspecific remote insults.  Small left mastoid effusion noted.  Initially MRI total spine ordered however imaging location advised would have to be ordered separately.  Upcoming imaging of spine scheduled in several weeks.  Will give Rx for Valium for patient to take 30 minutes prior to study.  Encouraged to keep upcoming appointment with neurology.  Can increase gabapentin to 100 mg 3 times daily.  Given strict precautions for any increased symptoms  Follow-up as needed   I discussed the assessment and treatment plan with the patient. The patient was provided an opportunity to ask questions and all were answered. The patient agreed with the plan and demonstrated an understanding of the instructions.   The patient was advised to call back or seek an in-person evaluation if the symptoms worsen or if the condition fails to improve as anticipated.    Billie Ruddy, MD

## 2021-03-22 ENCOUNTER — Encounter: Payer: Self-pay | Admitting: Family Medicine

## 2021-03-22 MED ORDER — DIAZEPAM 5 MG PO TABS: ORAL_TABLET | ORAL | 0 refills | Status: DC

## 2021-04-02 ENCOUNTER — Encounter: Payer: Self-pay | Admitting: Family Medicine

## 2021-04-02 ENCOUNTER — Telehealth: Payer: Self-pay | Admitting: Family Medicine

## 2021-04-02 NOTE — Telephone Encounter (Signed)
Kayla from [facility] states that she needs clarification on these orders and DX R20.0, R20.2 (ICD-10-CM) - Numbness and tingling of both upper extremities. R20.0, R20.2 (ICD-10-CM) - Numbness and tingling of both lower extremities. Both were used for diagnosis  R29.898 (ICD-10-CM) - Weakness of both legs Order: [id number] - MR Formoso said that the DX does go with the patient's procedures and wanted to know if the Dr meant to order a thoracic Mri instead; contact Keyla at [phone] to clarify orders

## 2021-04-03 ENCOUNTER — Other Ambulatory Visit: Payer: No Typology Code available for payment source

## 2021-04-03 ENCOUNTER — Inpatient Hospital Stay: Admission: RE | Admit: 2021-04-03 | Payer: No Typology Code available for payment source | Source: Ambulatory Visit

## 2021-04-03 ENCOUNTER — Other Ambulatory Visit: Payer: Self-pay

## 2021-04-03 ENCOUNTER — Ambulatory Visit
Admission: RE | Admit: 2021-04-03 | Discharge: 2021-04-03 | Disposition: A | Discharge status: Home / Self Care | Payer: No Typology Code available for payment source | Source: Ambulatory Visit | Attending: Family Medicine | Admitting: Family Medicine

## 2021-04-03 DIAGNOSIS — R29898 Other symptoms and signs involving the musculoskeletal system: Secondary | ICD-10-CM

## 2021-04-03 DIAGNOSIS — R202 Paresthesia of skin: Secondary | ICD-10-CM

## 2021-04-03 MED ORDER — GADOBENATE DIMEGLUMINE 529 MG/ML IV SOLN
17.0000 mL | Freq: Once | INTRAVENOUS | Status: AC | PRN
  Administered 2021-04-03: 17 mL via INTRAVENOUS

## 2021-04-05 ENCOUNTER — Ambulatory Visit: Payer: No Typology Code available for payment source | Admitting: Neurology

## 2021-04-05 ENCOUNTER — Encounter: Payer: Self-pay | Admitting: Neurology

## 2021-04-05 ENCOUNTER — Other Ambulatory Visit: Payer: Self-pay | Admitting: Family Medicine

## 2021-04-05 ENCOUNTER — Other Ambulatory Visit: Payer: Self-pay

## 2021-04-05 VITALS — BP 132/83 | HR 83 | Ht 66.5 in | Wt 187.0 lb

## 2021-04-05 DIAGNOSIS — M5124 Other intervertebral disc displacement, thoracic region: Secondary | ICD-10-CM

## 2021-04-05 DIAGNOSIS — R202 Paresthesia of skin: Secondary | ICD-10-CM

## 2021-04-05 NOTE — Progress Notes (Signed)
[facility] Clinic Note - Initial Visit   Date: 04/05/21  [name] MRN: [id number] DOB: [birth year]   Dear Dr. Volanda Napoleon:  Thank you for your kind referral of [name] for consultation of numbness/tingling. Although her history is well known to you, please allow [place] to reiterate it for the purpose of our medical record. The patient was accompanied to the clinic by self.  History of Present Illness: Brandi Sutton is a 45 y.o. right-handed female presenting for evaluation of generalized numbness/tingling. Starting in May, she began having numbness in the feet, which then involves her lower legs, arms, spine, and head.  She has constant numbness of the hand and legs.  She has intermittent numbness of the spine and head.  The numbness also involves her groin. She also complains of thigh band-like sensation over her abdomen and back.  When severe, it makes her feel as if she is having a heart attack.  She also has swelling in the feet and generalized weakness.  She previously tried gabapentin and stopped this because of weight gain. PCP has done extensive testing already including MRI brain which was normal.  MRI thoracic spine shows central disc protrusion at T6-7, T7-8, and T8-9, no significant stenosis.  Labs including vitamin B12, TSH, folate, HbA1c, and ESR has been normal.  She denies any new medications or illnesses.  Her boss of 20+ years passed away in 11/15/2022 and she is tearful when talking about this.  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    Out-side paper records, electronic medical record, and images have been reviewed where available and summarized as:  MRI thoracic spine wo contrast 04/03/2021: Central disc protrusions at T6-7, T7-8, and T8-9 without significant stenosis.  MRI brain wwo contrast 03/19/2021: No evidence of acute intracranial abnormality.   There are a few scattered punctate T2/FLAIR hyperintense signal changes within the  bilateral cerebral white matter, compatible with sequela of nonspecific remote insults.   Otherwise unremarkable MRI appearance of the brain.   Small left mastoid effusion.  Lab Results  Component Value Date   HGBA1C 5.3 01/02/2021   Lab Results  Component Value Date   JTTSVXBL39 030 02/18/2021   Lab Results  Component Value Date   TSH 1.65 02/18/2021   Lab Results  Component Value Date   ESRSEDRATE 8 02/18/2021    Past Medical History:  Diagnosis Date   Allergy    ASTHMA 03/31/2007   no attack in years per patient    Asthma    allergy related   Breast mass, left    CONTACT DERMATITIS&OTHER ECZEMA DUE TO PLANTS 12/03/2009   Depression    DYSFUNCTIONAL UTERINE BLEEDING 01/24/2010   GERD (gastroesophageal reflux disease)    Headache    sinus headaches    HIP PAIN, RIGHT 01/22/2009   LOW BACK PAIN, ACUTE 04/17/2008    Past Surgical History:  Procedure Laterality Date   BREAST EXCISIONAL BIOPSY Left 2017   CHOLECYSTECTOMY N/A 12/25/2015   Procedure: LAPAROSCOPIC CHOLECYSTECTOMY;  Surgeon: Rolm Bookbinder, MD;  Location: [facility];  Service: General;  Laterality: N/A;   LAPAROSCOPIC VAGINAL HYSTERECTOMY WITH SALPINGECTOMY Bilateral 10/14/2018   Procedure: LAPAROSCOPIC ASSISTED VAGINAL HYSTERECTOMY WITH SALPINGECTOMY;  Surgeon: Olga Millers, MD;  Location: [facility]  Service: Gynecology;  Laterality: Bilateral;   RADIOACTIVE SEED GUIDED EXCISIONAL BREAST BIOPSY Left 07/14/2016   Procedure: RADIOACTIVE SEED GUIDED EXCISIONAL BREAST BIOPSY;  Surgeon: Rolm Bookbinder, MD;  Location: [facility];  Service: General;  Laterality: Left;   TONSILLECTOMY AND ADENOIDECTOMY     uterine abalation     WISDOM TOOTH EXTRACTION       Medications:  Outpatient Encounter Medications as of 04/05/2021  Medication Sig   diclofenac (VOLTAREN) 75 MG EC tablet Take 75 mg by mouth 2 (two) times daily.   hyoscyamine (LEVSIN SL) 0.125 MG SL tablet Place 1 tablet  (0.125 mg total) under the tongue every 6 (six) hours as needed.   Melatonin 2.5 MG CHEW Chew 2 each by mouth at bedtime. As needed- gummies for sleep   gabapentin (NEURONTIN) 100 MG capsule Take 1 capsule (100 mg total) by mouth 3 (three) times daily. (Patient not taking: Reported on 04/05/2021)   [DISCONTINUED] diazepam (VALIUM) 5 MG tablet Take 1 tab 30 minutes prior to imaging. (Patient not taking: Reported on 04/05/2021)   No facility-administered encounter medications on file as of 04/05/2021.    Allergies:  Allergies  Allergen Reactions   Penicillins Hives and Itching    Has patient had a PCN reaction causing immediate rash, facial/tongue/throat swelling, SOB or lightheadedness with hypotension: no Has patient had a PCN reaction causing severe rash involving mucus membranes or skin necrosis: no Has patient had a PCN reaction that required hospitalization no Has patient had a PCN reaction occurring within the last 10 years: yes If all of the above answers are "NO", then may proceed with Cephalosporin use.    Bupropion Hcl Hives and Rash   Sulfa Antibiotics Rash    Family History: Family History  Problem Relation Age of Onset   Depression Mother    Diabetes Mother        diet controlled   Transient ischemic attack Mother    Colon polyps Mother        pre-cancerous   Hypertension Father    Diabetes Father        diet controlled   Bowel Disease Father        blockage, had to wear a bag for awhile   Parkinson's disease Father    Stroke Maternal Grandfather    Lung cancer Paternal Grandfather     Social History: Social History   Tobacco Use   Smoking status: Former    Types: Cigarettes    Quit date: 12/09/2005    Years since quitting: 15.3   Smokeless tobacco: Never  Vaping Use   Vaping Use: Never used  Substance Use Topics   Alcohol use: Yes    Alcohol/week: 0.0 standard drinks    Comment: 1 beers per week liquor 1x week    Drug use: No   Social History    Social History Narrative   Right Handed   Lives in a one story home with a basement. Has to walk up a flight of stairs to walk up to her office.     Vital Signs:  BP 132/83   Pulse 83   Ht 5' 6.5" (1.689 m)   Wt 187 lb (84.8 kg)   LMP 03/12/2011   SpO2 99%   BMI 29.73 kg/m   Neurological Exam: MENTAL STATUS including orientation to time, place, person, recent and remote memory, attention span and concentration, language, and fund of knowledge is normal.  Speech is not dysarthric, mildly pressured.  Tearful when talking about the passing of her boss in May.  CRANIAL NERVES: II:  No visual field defects.   III-IV-VI: Pupils equal round and reactive to light.  Normal conjugate, extra-ocular eye movements  in all directions of gaze.  No nystagmus.  No ptosis.   V:  Normal facial sensation.    VII:  Normal facial symmetry and movements.   VIII:  Normal hearing and vestibular function.   IX-X:  Normal palatal movement.   XI:  Normal shoulder shrug and head rotation.   XII:  Normal tongue strength and range of motion, no deviation or fasciculation.  MOTOR:  No atrophy, fasciculations or abnormal movements.  No pronator drift.   Upper Extremity:  Right  Left  Deltoid  5/5   5/5   Biceps  5/5   5/5   Triceps  5/5   5/5   Infraspinatus 5/5  5/5  Medial pectoralis 5/5  5/5  Wrist extensors  5/5   5/5   Wrist flexors  5/5   5/5   Finger extensors  5/5   5/5   Finger flexors  5/5   5/5   Dorsal interossei  5/5   5/5   Abductor pollicis  5/5   5/5   Tone (Ashworth scale)  0  0   Lower Extremity:  Right  Left  Hip flexors  5/5   5/5   Hip extensors  5/5   5/5   Adductor 5/5  5/5  Abductor 5/5  5/5  Knee flexors  5/5   5/5   Knee extensors  5/5   5/5   Dorsiflexors  5/5   5/5   Plantarflexors  5/5   5/5   Toe extensors  5/5   5/5   Toe flexors  5/5   5/5   Tone (Ashworth scale)  0  0   MSRs:  Right        Left                  brachioradialis 2+  2+  biceps 2+  2+   triceps 2+  2+  patellar 2+  2+  ankle jerk 2+  2+  Hoffman no  no  plantar response down  down   SENSORY:  Reduced temperature and pin prick in patchy area of the left lower leg and foot; otherwise normal and symmetric perception of light touch, pinprick, vibration.  Romberg's sign absent.   COORDINATION/GAIT: Normal finger-to- nose-finger and heel-to-shin.  Intact rapid alternating movements bilaterally.  Able to rise from a chair without using arms.  Gait narrow based and stable. Stressed gait intact.  Unable to perform tandem gait.   IMPRESSION: Generalized and migratory paresthesias involving the head, spine, arms, and legs.  Neurological exam and MRI brain is both normal, which is reassuring.  Imaging personally viewed. MRI thoracic spine shows central disc protrusion in the mid-thoracic spine and given her band-like sensation, I will refer her for physical therapy for back strengthening.  I explained that these imaging finding does not explain her whole-body symptoms and I am not sure what is causing this.  Particularly, there is no neurological condition which could cause the diffuse and widespread nature of her symptoms.   To be complete, I offered NCS/EMG of the right arm and left leg but my overall suspicion for something like neuropathy is very low, as this would again, not involve the entire body.  Exam does not support Guillain Barre and have been going for too long.  Lastly, stress reaction is considered as a diagnosis of exclusion.   Thank you for allowing me to participate in patient's care.  If I can answer any additional questions, I would  be pleased to do so.    Sincerely,    Alcie Runions K. Posey Pronto, DO

## 2021-04-05 NOTE — Patient Instructions (Addendum)
Start physical therapy   Nerve testing right arm and the left leg.   ELECTROMYOGRAM AND NERVE CONDUCTION STUDIES (EMG/NCS) INSTRUCTIONS  How to Prepare The neurologist conducting the EMG will need to know if you have certain medical conditions. Tell the neurologist and other EMG lab personnel if you: Have a pacemaker or any other electrical medical device Take blood-thinning medications Have hemophilia, a blood-clotting disorder that causes prolonged bleeding Bathing Take a shower or bath shortly before your exam in order to remove oils from your skin. Don't apply lotions or creams before the exam.  What to Expect You'll likely be asked to change into a hospital gown for the procedure and lie down on an examination table. The following explanations can help you understand what will happen during the exam.  Electrodes. The neurologist or a technician places surface electrodes at various locations on your skin depending on where you're experiencing symptoms. Or the neurologist may insert needle electrodes at different sites depending on your symptoms.  Sensations. The electrodes will at times transmit a tiny electrical current that you may feel as a twinge or spasm. The needle electrode may cause discomfort or pain that usually ends shortly after the needle is removed. If you are concerned about discomfort or pain, you may want to talk to the neurologist about taking a short break during the exam.  Instructions. During the needle EMG, the neurologist will assess whether there is any spontaneous electrical activity when the muscle is at rest - activity that isn't present in healthy muscle tissue - and the degree of activity when you slightly contract the muscle.  He or she will give you instructions on resting and contracting a muscle at appropriate times. Depending on what muscles and nerves the neurologist is examining, he or she may ask you to change positions during the exam.  After your  EMG You may experience some temporary, minor bruising where the needle electrode was inserted into your muscle. This bruising should fade within several days. If it persists, contact your primary care doctor.

## 2021-04-06 ENCOUNTER — Other Ambulatory Visit: Payer: No Typology Code available for payment source

## 2021-04-10 NOTE — Telephone Encounter (Signed)
MRI has been completed.  

## 2021-05-03 ENCOUNTER — Ambulatory Visit: Payer: No Typology Code available for payment source | Admitting: Neurology

## 2021-05-08 ENCOUNTER — Other Ambulatory Visit: Payer: Self-pay

## 2021-05-08 ENCOUNTER — Ambulatory Visit: Payer: No Typology Code available for payment source | Admitting: Neurology

## 2021-05-08 DIAGNOSIS — R202 Paresthesia of skin: Secondary | ICD-10-CM | POA: Diagnosis not present

## 2021-05-08 DIAGNOSIS — G5621 Lesion of ulnar nerve, right upper limb: Secondary | ICD-10-CM

## 2021-05-08 NOTE — Procedures (Signed)
[facility]  [address], [address]  [place], [place] [address] Tel: [phone] Fax:  [phone] Test Date:  05/08/2021  Patient: Brandi Sutton DOB: [birth year] Physician: Narda Amber, DO  Sex: Female Height: 5\' 6"  Ref Phys: Narda Amber, DO  ID#: [id number]   Technician:    Patient Complaints: This is a 45 year old female referred for evaluation of generalized paresthesias.  NCV & EMG Findings: Electrodiagnostic testing of the right upper extremity and additional studies of the left lower extremity shows:  Right median, ulnar, and mixed palmar sensory responses are within normal limits.  Left sural and superficial peroneal sensory responses are within normal limits. Right median, left peroneal, and left tibial motor responses are within normal limits.  Right ulnar motor response shows slowed conduction velocity across the elbow (A Elbow-B Elbow, 43 m/s).   Left tibial H reflex study is within normal limits.   There is no evidence of active or chronic motor axonal loss changes affecting any of the tested muscles.  Motor unit configuration and recruitment pattern is within normal limits.    Impression: Right ulnar neuropathy with slowing across the elbow, demyelinating, mild. There is no evidence of a sensorimotor polyneuropathy or cervical/lumbosacral radiculopathy affecting the right upper or left lower extremities.   ___________________________ Narda Amber, DO    Nerve Conduction Studies Anti Sensory Summary Table   Stim Site NR Peak (ms) Norm Peak (ms) P-T Amp (V) Norm P-T Amp  Right Median Anti Sensory (2nd Digit)  34C  Wrist    2.9 <3.4 37.5 >20  Left Sup Peroneal Anti Sensory (Ant Lat Mall)  34C  12 cm    2.2 <4.5 15.5 >5  Left Sural Anti Sensory (Lat Mall)  34C  Calf    2.9 <4.5 11.8 >5  Right Ulnar Anti Sensory (5th Digit)  34C  Wrist    2.6 <3.1 42.0 >12   Motor Summary Table   Stim Site NR Onset (ms) Norm Onset (ms) O-P Amp (mV) Norm O-P  Amp Site1 Site2 Delta-0 (ms) Dist (cm) Vel (m/s) Norm Vel (m/s)  Right Median Motor (Abd Poll Brev)  34C  Wrist    3.0 <3.9 12.5 >6 Elbow Wrist 4.3 26.0 60 >50  Elbow    7.3  12.2         Left Peroneal Motor (Ext Dig Brev)  34C  Ankle    4.0 <5.5 3.0 >3 B Fib Ankle 6.7 38.0 57 >40  B Fib    10.7  2.3  Poplt B Fib 1.4 8.0 57 >40  Poplt    12.1  2.2         Left Tibial Motor (Abd Hall Brev)  34C  Ankle    2.8 <6.0 18.5 >8 Knee Ankle 8.5 41.0 48 >40  Knee    11.3  13.7         Right Ulnar Motor (Abd Dig Minimi)  34C  Wrist    2.1 <3.1 12.0 >7 B Elbow Wrist 3.4 21.0 62 >50  B Elbow    5.5  11.2  A Elbow B Elbow 2.3 10.0 43 >50  A Elbow    7.8  10.5          Comparison Summary Table   Stim Site NR Peak (ms) Norm Peak (ms) P-T Amp (V) Site1 Site2 Delta-P (ms) Norm Delta (ms)  Right Median/Ulnar Palm Comparison (Wrist - 8cm)  34C  Median Palm    1.7 <2.2 49.1 Median Palm [employer]  0.2   Ulnar Palm    1.5 <2.2 18.8       H Reflex Studies   NR H-Lat (ms) Lat Norm (ms) L-R H-Lat (ms)  Left Tibial (Gastroc)  34C     30.61 <35    EMG   Side Muscle Ins Act Fibs Psw Fasc Number Recrt Dur Dur. Amp Amp. Poly Poly. Comment  Right 1stDorInt Nml Nml Nml Nml Nml Nml Nml Nml Nml Nml Nml Nml N/A  Right PronatorTeres Nml Nml Nml Nml Nml Nml Nml Nml Nml Nml Nml Nml N/A  Right Biceps Nml Nml Nml Nml Nml Nml Nml Nml Nml Nml Nml Nml N/A  Right Triceps Nml Nml Nml Nml Nml Nml Nml Nml Nml Nml Nml Nml N/A  Right Deltoid Nml Nml Nml Nml Nml Nml Nml Nml Nml Nml Nml Nml N/A  Right FlexCarpiUln Nml Nml Nml Nml Nml Nml Nml Nml Nml Nml Nml Nml N/A  Left AntTibialis Nml Nml Nml Nml Nml Nml Nml Nml Nml Nml Nml Nml N/A  Left Gastroc Nml Nml Nml Nml Nml Nml Nml Nml Nml Nml Nml Nml N/A  Left Flex Dig Long Nml Nml Nml Nml Nml Nml Nml Nml Nml Nml Nml Nml N/A  Left RectFemoris Nml Nml Nml Nml Nml Nml Nml Nml Nml Nml Nml Nml N/A  Left GluteusMed Nml Nml Nml Nml Nml Nml Nml Nml Nml Nml Nml Nml N/A       Waveforms:

## 2021-08-10 ENCOUNTER — Ambulatory Visit
Admission: EM | Admit: 2021-08-10 | Discharge: 2021-08-10 | Disposition: A | Discharge status: Home / Self Care | Payer: No Typology Code available for payment source | Attending: Emergency Medicine | Admitting: Emergency Medicine

## 2021-08-10 ENCOUNTER — Encounter: Payer: Self-pay | Admitting: Emergency Medicine

## 2021-08-10 ENCOUNTER — Other Ambulatory Visit: Payer: Self-pay

## 2021-08-10 DIAGNOSIS — R21 Rash and other nonspecific skin eruption: Secondary | ICD-10-CM | POA: Diagnosis not present

## 2021-08-10 MED ORDER — PREDNISONE 10 MG (21) PO TBPK: ORAL_TABLET | Freq: Every day | ORAL | 0 refills | Status: DC

## 2021-08-10 NOTE — ED Triage Notes (Signed)
Pt here with hives that burn over her entire body x 2 days. Unknown cause, no airway involvement. Is taking benadryl with no relief.

## 2021-08-10 NOTE — Discharge Instructions (Addendum)
Take the prednisone taper as directed.    Take Benadryl every 6 hours as directed; do not drive, operate machinery, or drink alcohol with this medication as it may cause drowsiness.    Follow up with your primary care provider if your symptoms are not improving.

## 2021-08-10 NOTE — ED Provider Notes (Signed)
[facility]    CSN: [id number] Arrival date & time: 08/10/21  4132      History   Chief Complaint Chief Complaint  Patient presents with   Rash    HPI Brandi Sutton is a 45 y.o. female.  Patient presents with 2-day history of "burning" mildly pruritic rash on her arms, trunk, legs.  The rash started on her left forearm and then spread to the rest of her body.  No open wounds or drainage.  No difficulty swallowing or breathing.  She denies any new products medications, foods.  No fever, chills, sore throat, cough, wheezing, shortness of breath or other symptoms.  Treatment at home with Benadryl.  Patient's medical history includes asthma, seasonal allergies, contact dermatitis, GERD, arthritis.     The history is provided by the patient and medical records.   Past Medical History:  Diagnosis Date   Allergy    ASTHMA 03/31/2007   no attack in years per patient    Asthma    allergy related   Breast mass, left    CONTACT DERMATITIS&OTHER ECZEMA DUE TO PLANTS 12/03/2009   Depression    DYSFUNCTIONAL UTERINE BLEEDING 01/24/2010   GERD (gastroesophageal reflux disease)    Headache    sinus headaches    HIP PAIN, RIGHT 01/22/2009   LOW BACK PAIN, ACUTE 04/17/2008    Patient Active Problem List   Diagnosis Date Noted   Arthritis of shoulder 01/02/2020   History of asthma 01/02/2020   Gastroesophageal reflux disease 01/02/2020   Seasonal allergies 01/02/2020   Chronic pelvic pain in female 10/14/2018   Abdominal pain 06/15/2018   Routine general medical examination at a health care facility 06/15/2018   Allergic rhinitis 07/19/2013   DYSFUNCTIONAL UTERINE BLEEDING 01/24/2010   CONTACT DERMATITIS&OTHER ECZEMA DUE TO PLANTS 12/03/2009    Past Surgical History:  Procedure Laterality Date   BREAST EXCISIONAL BIOPSY Left 2017   CHOLECYSTECTOMY N/A 12/25/2015   Procedure: LAPAROSCOPIC CHOLECYSTECTOMY;  Surgeon: Rolm Bookbinder, MD;  Location: [facility];  Service:  General;  Laterality: N/A;   LAPAROSCOPIC VAGINAL HYSTERECTOMY WITH SALPINGECTOMY Bilateral 10/14/2018   Procedure: LAPAROSCOPIC ASSISTED VAGINAL HYSTERECTOMY WITH SALPINGECTOMY;  Surgeon: Olga Millers, MD;  Location[facility];  Service: Gynecology;  Laterality: Bilateral;   RADIOACTIVE SEED GUIDED EXCISIONAL BREAST BIOPSY Left 07/14/2016   Procedure: RADIOACTIVE SEED GUIDED EXCISIONAL BREAST BIOPSY;  Surgeon: Rolm Bookbinder, MD;  Location: [facility];  Service: General;  Laterality: Left;   TONSILLECTOMY AND ADENOIDECTOMY     uterine abalation     WISDOM TOOTH EXTRACTION      OB History   No obstetric history on file.      Home Medications    Prior to Admission medications   Medication Sig Start Date End Date Taking? Authorizing Provider  predniSONE (STERAPRED UNI-PAK 21 TAB) 10 MG (21) TBPK tablet Take by mouth daily. As directed 08/10/21  Yes Sharion Balloon, NP  diclofenac (VOLTAREN) 75 MG EC tablet Take 75 mg by mouth 2 (two) times daily.    [provider]  gabapentin (NEURONTIN) 100 MG capsule Take 1 capsule (100 mg total) by mouth 3 (three) times daily. Patient not taking: Reported on 04/05/2021 03/11/21   Billie Ruddy, MD  hyoscyamine (LEVSIN SL) 0.125 MG SL tablet Place 1 tablet (0.125 mg total) under the tongue every 6 (six) hours as needed. 03/19/21   Doran Stabler, MD  Melatonin 2.5 MG CHEW Chew 2 each  by mouth at bedtime. As needed- gummies for sleep    [provider]    Family History Family History  Problem Relation Age of Onset   Depression Mother    Diabetes Mother        diet controlled   Transient ischemic attack Mother    Colon polyps Mother        pre-cancerous   Hypertension Father    Diabetes Father        diet controlled   Bowel Disease Father        blockage, had to wear a bag for awhile   Parkinson's disease Father    Stroke Maternal Grandfather    Lung cancer Paternal Grandfather      Social History Social History   Tobacco Use   Smoking status: Former    Types: Cigarettes    Quit date: 12/09/2005    Years since quitting: 15.6   Smokeless tobacco: Never  Vaping Use   Vaping Use: Never used  Substance Use Topics   Alcohol use: Yes    Alcohol/week: 0.0 standard drinks    Comment: 1 beers per week liquor 1x week    Drug use: No     Allergies   Penicillins, Bupropion hcl, and Sulfa antibiotics   Review of Systems Review of Systems  Constitutional:  Negative for chills and fever.  HENT:  Negative for ear pain and sore throat.   Respiratory:  Negative for cough and shortness of breath.   Cardiovascular:  Negative for chest pain and palpitations.  Gastrointestinal:  Negative for diarrhea and vomiting.  Skin:  Positive for rash. Negative for wound.  All other systems reviewed and are negative.   Physical Exam Triage Vital Signs ED Triage Vitals  Enc Vitals Group     BP 08/10/21 0848 140/86     Pulse Rate 08/10/21 0848 84     Resp 08/10/21 0848 18     Temp 08/10/21 0848 98.5 F (36.9 C)     Temp src --      SpO2 08/10/21 0848 98 %     Weight --      Height --      Head Circumference --      Peak Flow --      Pain Score 08/10/21 0850 3     Pain Loc --      Pain Edu? --      Excl. in [place]? --    No data found.  Updated Vital Signs BP 140/86    Pulse 84    Temp 98.5 F (36.9 C)    Resp 18    LMP 03/12/2011    SpO2 98%   Visual Acuity Right Eye Distance:   Left Eye Distance:   Bilateral Distance:    Right Eye Near:   Left Eye Near:    Bilateral Near:     Physical Exam Vitals and nursing note reviewed.  Constitutional:      General: She is not in acute distress.    Appearance: Normal appearance. She is well-developed.  HENT:     Right Ear: Tympanic membrane normal.     Left Ear: Tympanic membrane normal.     Nose: Nose normal.     Mouth/Throat:     Mouth: Mucous membranes are moist.     Pharynx: Oropharynx is clear.   Cardiovascular:     Rate and Rhythm: Normal rate and regular rhythm.     Heart sounds: Normal heart sounds.  Pulmonary:     Effort: Pulmonary effort is normal. No respiratory distress.     Breath sounds: Normal breath sounds. No wheezing.  Musculoskeletal:     Cervical back: Neck supple.  Skin:    General: Skin is warm and dry.     Findings: Rash present.     Comments: Erythematous rash on trunk and extremities.  See pictures for details.  Neurological:     Mental Status: She is alert.  Psychiatric:        Mood and Affect: Mood normal.        Behavior: Behavior normal.        UC Treatments / Results  Labs (all labs ordered are listed, but only abnormal results are displayed) Labs Reviewed - No data to display  EKG   Radiology No results found.  Procedures Procedures (including critical care time)  Medications Ordered in UC Medications - No data to display  Initial Impression / Assessment and Plan / UC Course  I have reviewed the triage vital signs and the nursing notes.  Pertinent labs & imaging results that were available during my care of the patient were reviewed by me and considered in my medical decision making (see chart for details).   Rash.  Treating with prednisone taper and Benadryl.  Discussed precautions for drowsiness with Benadryl.  Education provided on rash.  Instructed patient to follow-up with her PCP if her symptoms are not improving.  She agrees to plan of care   Final Clinical Impressions(s) / UC Diagnoses   Final diagnoses:  Rash     Discharge Instructions      Take the prednisone taper as directed.    Take Benadryl every 6 hours as directed; do not drive, operate machinery, or drink alcohol with this medication as it may cause drowsiness.    Follow up with your primary care provider if your symptoms are not improving.        ED Prescriptions     Medication Sig Dispense Auth. Provider   predniSONE (STERAPRED UNI-PAK 21  TAB) 10 MG (21) TBPK tablet Take by mouth daily. As directed 21 tablet Sharion Balloon, NP      PDMP not reviewed this encounter.   Sharion Balloon, NP 08/10/21 [phone]

## 2021-09-20 ENCOUNTER — Encounter: Payer: Self-pay | Admitting: Family Medicine

## 2021-09-20 ENCOUNTER — Ambulatory Visit: Payer: BC Managed Care – PPO | Admitting: Family Medicine

## 2021-09-20 DIAGNOSIS — H1031 Unspecified acute conjunctivitis, right eye: Secondary | ICD-10-CM

## 2021-09-20 MED ORDER — OFLOXACIN 0.3 % OP SOLN: 1.0000 [drp] | Freq: Four times a day (QID) | OPHTHALMIC | 0 refills | Status: DC

## 2021-09-20 NOTE — Progress Notes (Signed)
Novella Rob DOB: [birth year] Encounter date: 09/20/2021  This is a 46 y.o. female who presents with Chief Complaint  Patient presents with   Eye Problem    Patient complains of right eye redness, pain and matting x1 day, tried compresses    History of present illness:  Yesterday afternoon eye was a little itchy, thought allergy. Then got really sore and this morning had some matting around it. Left eye is a little itchy today. Eye just sore in outer corner.   No sinus sx. No fevers.   Allergies  Allergen Reactions   Penicillins Hives and Itching    Has patient had a PCN reaction causing immediate rash, facial/tongue/throat swelling, SOB or lightheadedness with hypotension: no Has patient had a PCN reaction causing severe rash involving mucus membranes or skin necrosis: no Has patient had a PCN reaction that required hospitalization no Has patient had a PCN reaction occurring within the last 10 years: yes If all of the above answers are "NO", then may proceed with Cephalosporin use.    Bupropion Hcl Hives and Rash   Sulfa Antibiotics Rash   Current Meds  Medication Sig   hyoscyamine (LEVSIN SL) 0.125 MG SL tablet Place 1 tablet (0.125 mg total) under the tongue every 6 (six) hours as needed.   Melatonin 2.5 MG CHEW Chew 2 each by mouth at bedtime. As needed- gummies for sleep    Review of Systems  Constitutional:  Negative for chills, fatigue and fever.  Eyes:  Positive for pain, discharge (clear, sticky), redness and itching.  Respiratory:  Negative for cough, chest tightness, shortness of breath and wheezing.   Cardiovascular:  Negative for chest pain, palpitations and leg swelling.   Objective:  BP 112/80 (BP Location: Left Arm, Patient Position: Sitting, Cuff Size: Large)    Pulse 73    Temp 97.7 F (36.5 C) (Oral)    Ht 5' 6.5" (1.689 m)    Wt 200 lb 3.2 oz (90.8 kg)    LMP 03/12/2011    SpO2 96%    BMI 31.83 kg/m   Weight: 200 lb 3.2 oz (90.8 kg)   BP Readings  from Last 3 Encounters:  09/20/21 112/80  08/10/21 140/86  04/05/21 132/83   Wt Readings from Last 3 Encounters:  09/20/21 200 lb 3.2 oz (90.8 kg)  04/05/21 187 lb (84.8 kg)  02/18/21 186 lb 9.6 oz (84.6 kg)    Physical Exam Constitutional:      General: She is not in acute distress.    Appearance: She is well-developed.  Eyes:     Comments: Right lower lid edema, light erythema. Injection lateral eyeball. No pain with ROM of eye, no eye ball tenderness.  Cardiovascular:     Rate and Rhythm: Normal rate and regular rhythm.     Heart sounds: Normal heart sounds. No murmur heard.   No friction rub.  Pulmonary:     Effort: Pulmonary effort is normal. No respiratory distress.     Breath sounds: Normal breath sounds. No wheezing or rales.  Musculoskeletal:     Right lower leg: No edema.     Left lower leg: No edema.  Neurological:     Mental Status: She is alert and oriented to person, place, and time.  Psychiatric:        Behavior: Behavior normal.    Assessment/Plan   1. Acute bacterial conjunctivitis of right eye Tolerated this drop well in past. Will try again. Continue with warm compresses. Discussed that  typically will see thicker, more purulent drainage with bacterial infection; suspect more viral or allergic,but due to lower lid edema,tenderness, will cover with ofloxacin. If worsening, seek eval. Should be better in 48 hours.  - ofloxacin (OCUFLOX) 0.3 % ophthalmic solution; Place 1 drop into the right eye 4 (four) times daily.  Dispense: 5 mL; Refill: 0   Return if symptoms worsen or fail to improve.    Micheline Rough, MD

## 2021-11-04 ENCOUNTER — Ambulatory Visit: Payer: BC Managed Care – PPO | Admitting: Family Medicine

## 2021-11-04 ENCOUNTER — Encounter: Payer: Self-pay | Admitting: Family Medicine

## 2021-11-04 VITALS — BP 131/84 | HR 72 | Temp 98.4°F | Wt 201.0 lb

## 2021-11-04 DIAGNOSIS — R600 Localized edema: Secondary | ICD-10-CM | POA: Diagnosis not present

## 2021-11-04 DIAGNOSIS — R682 Dry mouth, unspecified: Secondary | ICD-10-CM

## 2021-11-04 DIAGNOSIS — M2550 Pain in unspecified joint: Secondary | ICD-10-CM

## 2021-11-04 MED ORDER — FUROSEMIDE 20 MG PO TABS: 20.0000 mg | ORAL_TABLET | Freq: Every day | ORAL | 0 refills | Status: DC

## 2021-11-04 NOTE — Progress Notes (Signed)
Subjective:  ? ? Patient ID: Brandi Sutton, female    DOB: 1975/12/06, 46 y.o.   MRN: [id number] ? ?Chief Complaint  ?Patient presents with  ? Edema  ?  Has had the swelling since last year, has been wearing compression socks. Swelling has went down and compression socks helped the feet and legs, but has swelling al over -feet, ankles, legs, thighs. Has pain in joints.   ? ? ?HPI ?Patient was seen today for ongoing concern.  Patient endorses joint pain and edema x1-2 months.  Patient endorses morning stiffness in hips, knees, wrist, thumbs, shoulders then improves with movement.  Also noticing dry eyes and dry mouth.  Pt endorses continued LE edema in bilateral.  Now starting to see veins and ankles and top of foot.  Wearing compression socks, decreasing sodium intake, using Tylenol sparingly, and taking Epsom salt baths.  Patient has family history of RA on her mother side.  Patient's mom has history of plaque psoriasis. ? ?Pt was seen by neurology for history of paresthesias.  EMG/NCS 05/08/2021 with ulnar nerve compression and right elbow. ? ?Past Medical History:  ?Diagnosis Date  ? Allergy   ? ASTHMA 03/31/2007  ? no attack in years per patient   ? Asthma   ? allergy related  ? Breast mass, left   ? CONTACT DERMATITIS&OTHER ECZEMA DUE TO PLANTS 12/03/2009  ? Depression   ? DYSFUNCTIONAL UTERINE BLEEDING 01/24/2010  ? GERD (gastroesophageal reflux disease)   ? Headache   ? sinus headaches   ? HIP PAIN, RIGHT 01/22/2009  ? LOW BACK PAIN, ACUTE 04/17/2008  ? ? ?Allergies  ?Allergen Reactions  ? Penicillins Hives and Itching  ?  Has patient had a PCN reaction causing immediate rash, facial/tongue/throat swelling, SOB or lightheadedness with hypotension: no ?Has patient had a PCN reaction causing severe rash involving mucus membranes or skin necrosis: no ?Has patient had a PCN reaction that required hospitalization no ?Has patient had a PCN reaction occurring within the last 10 years: yes ?If all of the above answers are  "NO", then may proceed with Cephalosporin use. ?  ? Bupropion Hcl Hives and Rash  ? Sulfa Antibiotics Rash  ? ? ?ROS ?General: Denies fever, chills, night sweats, changes in weight, changes in appetite ?HEENT: Denies headaches, ear pain, changes in vision, rhinorrhea, sore throat ?CV: Denies CP, palpitations, SOB, orthopnea + LE edema ?Pulm: Denies SOB, cough, wheezing ?GI: Denies abdominal pain, nausea, vomiting, diarrhea, constipation ?GU: Denies dysuria, hematuria, frequency, vaginal discharge ?Msk: Denies muscle cramps + joint pain, stiffness ?Neuro: Denies weakness, numbness, tingling ?Skin: Denies rashes, bruising ?Psych: Denies depression, anxiety, hallucinations ? ?   ?Objective:  ?  ?Blood pressure 131/84, pulse 72, temperature 98.4 ?F (36.9 ?C), temperature source Oral, weight 201 lb (91.2 kg), last menstrual period 03/12/2011, SpO2 98 %. ? ?Gen. Pleasant, well-nourished, in no distress, normal affect   ?HEENT: [place]/AT, face symmetric, conjunctiva clear, no scleral icterus, PERRLA, EOMI, nares patent without drainage ?Lungs: no accessory muscle use, CTAB, no wheezes or rales ?Cardiovascular: RRR, no m/r/g, trace edema in the bilateral LEs to shin.  Faint veins visualized in bilateral inferior to lateral malleolus.  No spider veins or varicose veins on upper bilateral legs ?Musculoskeletal: No joint edema or deformities, no cyanosis or clubbing, normal tone ?Neuro:  A&Ox3, CN II-XII intact, normal gait ?Skin:  Warm, no lesions/ rash ? ? ?Wt Readings from Last 3 Encounters:  ?09/20/21 200 lb 3.2 oz (90.8 kg)  ?04/05/21 187  lb (84.8 kg)  ?02/18/21 186 lb 9.6 oz (84.6 kg)  ? ? ?Lab Results  ?Component Value Date  ? WBC 6.0 02/18/2021  ? HGB 13.8 02/18/2021  ? HCT 40.5 02/18/2021  ? PLT 227.0 02/18/2021  ? GLUCOSE 84 02/18/2021  ? CHOL 186 01/02/2021  ? TRIG 102.0 01/02/2021  ? HDL 55.30 01/02/2021  ? LDLCALC 110 (H) 01/02/2021  ? ALT 21 02/18/2021  ? AST 21 02/18/2021  ? NA 140 02/18/2021  ? K 4.1 02/18/2021  ?  CL 103 02/18/2021  ? CREATININE 0.84 02/18/2021  ? BUN 12 02/18/2021  ? CO2 26 02/18/2021  ? TSH 1.65 02/18/2021  ? HGBA1C 5.3 01/02/2021  ? ? ?Assessment/Plan: ? ?Bilateral leg edema  ?- Plan: ANA, Lupus (SLE) Analysis, furosemide (LASIX) 20 MG tablet, CMP, TSH, T4, Free, Brain Natriuretic Peptide ? ?Pain in joint involving multiple sites ?-New issue ?- Plan: ANA, Lupus (SLE) Analysis, CBC with Differential/Platelet, C-reactive Protein, Sedimentation Rate ? ?Dry mouth ? ?Discussed possible causes of joint pain and edema such as autoimmune disorder such as RA-given family history on mom side, lupus, etc.  Discussed obtaining labs to further evaluate continue OTC Tylenol as needed.  Treatment of symptoms with heat, stretching, topical analgesics, Epsom salt baths, compression socks, LE elevation, etc. encouraged.  Will place rheumatology referral if needed based on lab results.  Discussed short course of Lasix 20 mg for LE edema. ? ?F/u as needed in the next 2-4 weeks, sooner if needed ? ?Grier Mitts, MD ?

## 2021-11-05 LAB — COMPREHENSIVE METABOLIC PANEL
ALT: 21 U/L (ref 0–35)
AST: 21 U/L (ref 0–37)
Albumin: 4.3 g/dL (ref 3.5–5.2)
Alkaline Phosphatase: 57 U/L (ref 39–117)
BUN: 7 mg/dL (ref 6–23)
CO2: 26 mEq/L (ref 19–32)
Calcium: 9 mg/dL (ref 8.4–10.5)
Chloride: 104 mEq/L (ref 96–112)
Creatinine, Ser: 0.84 mg/dL (ref 0.40–1.20)
GFR: 82.28 mL/min (ref >60.00)
Glucose, Bld: 70 mg/dL (ref 70–99)
Potassium: 3.6 mEq/L (ref 3.5–5.1)
Sodium: 139 mEq/L (ref 135–145)
Total Bilirubin: 0.4 mg/dL (ref 0.2–1.2)
Total Protein: 6.7 g/dL (ref 6.0–8.3)

## 2021-11-05 LAB — CBC WITH DIFFERENTIAL/PLATELET
Basophils Absolute: 0 10*3/uL (ref 0.0–0.1)
Basophils Relative: 0.9 % (ref 0.0–3.0)
Eosinophils Absolute: 0.2 10*3/uL (ref 0.0–0.7)
Eosinophils Relative: 4.4 % (ref 0.0–5.0)
HCT: 40.8 % (ref 36.0–46.0)
Hemoglobin: 14 g/dL (ref 12.0–15.0)
Lymphocytes Relative: 35.6 % (ref 12.0–46.0)
Lymphs Abs: 1.9 10*3/uL (ref 0.7–4.0)
MCHC: 34.4 g/dL (ref 30.0–36.0)
MCV: 90.7 fl (ref 78.0–100.0)
Monocytes Absolute: 0.4 10*3/uL (ref 0.1–1.0)
Monocytes Relative: 6.8 % (ref 3.0–12.0)
Neutro Abs: 2.8 10*3/uL (ref 1.4–7.7)
Neutrophils Relative %: 52.3 % (ref 43.0–77.0)
Platelets: 236 10*3/uL (ref 150.0–400.0)
RBC: 4.49 Mil/uL (ref 3.87–5.11)
RDW: 12.6 % (ref 11.5–15.5)
WBC: 5.3 10*3/uL (ref 4.0–10.5)

## 2021-11-05 LAB — SEDIMENTATION RATE: Sed Rate: 10 mm/hr (ref 0–20)

## 2021-11-05 LAB — ANA: Anti Nuclear Antibody (ANA): NEGATIVE

## 2021-11-05 LAB — BRAIN NATRIURETIC PEPTIDE: Pro B Natriuretic peptide (BNP): 24 pg/mL (ref 0.0–100.0)

## 2021-11-05 LAB — T4, FREE: Free T4: 0.78 ng/dL (ref 0.60–1.60)

## 2021-11-05 LAB — TSH: TSH: 2.41 u[IU]/mL (ref 0.35–5.50)

## 2021-11-05 LAB — C-REACTIVE PROTEIN: CRP: <1 mg/dL (ref 0.5–20.0)

## 2021-11-06 LAB — LUPUS (SLE) ANALYSIS
Anti Nuclear Antibody (ANA): NEGATIVE
Anti-striation Abs: NEGATIVE
Complement C4, Serum: 33 mg/dL (ref 12–38)
ENA RNP Ab: <0.2 AI (ref 0.0–0.9)
ENA SM Ab Ser-aCnc: <0.2 AI (ref 0.0–0.9)
ENA SSA (RO) Ab: 0.2 AI (ref 0.0–0.9)
ENA SSB (LA) Ab: <0.2 AI (ref 0.0–0.9)
Mitochondrial Ab: <20 Units (ref 0.0–20.0)
Parietal Cell Ab: 37.6 Units — ABNORMAL HIGH (ref 0.0–20.0)
Scleroderma (Scl-70) (ENA) Antibody, IgG: <0.2 AI (ref 0.0–0.9)
Smooth Muscle Ab: 4 Units (ref 0–19)
Thyroperoxidase Ab SerPl-aCnc: 37 IU/mL — ABNORMAL HIGH (ref 0–34)
dsDNA Ab: 1 IU/mL (ref 0–9)

## 2021-11-07 ENCOUNTER — Encounter: Payer: Self-pay | Admitting: Family Medicine

## 2021-11-08 ENCOUNTER — Other Ambulatory Visit: Payer: Self-pay

## 2021-11-08 DIAGNOSIS — R899 Unspecified abnormal finding in specimens from other organs, systems and tissues: Secondary | ICD-10-CM

## 2022-01-03 ENCOUNTER — Ambulatory Visit (INDEPENDENT_AMBULATORY_CARE_PROVIDER_SITE_OTHER): Payer: BC Managed Care – PPO | Admitting: Family Medicine

## 2022-01-03 ENCOUNTER — Encounter: Payer: Self-pay | Admitting: Family Medicine

## 2022-01-03 VITALS — Temp 98.1°F | Ht 65.25 in | Wt 202.0 lb

## 2022-01-03 DIAGNOSIS — R601 Generalized edema: Secondary | ICD-10-CM

## 2022-01-03 DIAGNOSIS — R053 Chronic cough: Secondary | ICD-10-CM

## 2022-01-03 DIAGNOSIS — Z0001 Encounter for general adult medical examination with abnormal findings: Secondary | ICD-10-CM

## 2022-01-03 DIAGNOSIS — R1084 Generalized abdominal pain: Secondary | ICD-10-CM | POA: Diagnosis not present

## 2022-01-03 DIAGNOSIS — K582 Mixed irritable bowel syndrome: Secondary | ICD-10-CM | POA: Diagnosis not present

## 2022-01-03 DIAGNOSIS — Z0000 Encounter for general adult medical examination without abnormal findings: Secondary | ICD-10-CM

## 2022-01-03 DIAGNOSIS — J302 Other seasonal allergic rhinitis: Secondary | ICD-10-CM

## 2022-01-03 LAB — LIPID PANEL
Cholesterol: 183 mg/dL (ref 0–200)
HDL: 50.5 mg/dL (ref >39.00)
LDL Cholesterol: 108 mg/dL — ABNORMAL HIGH (ref 0–99)
NonHDL: 132.59
Total CHOL/HDL Ratio: 4
Triglycerides: 121 mg/dL (ref 0.0–149.0)
VLDL: 24.2 mg/dL (ref 0.0–40.0)

## 2022-01-03 LAB — CBC WITH DIFFERENTIAL/PLATELET
Basophils Absolute: 0.1 10*3/uL (ref 0.0–0.1)
Basophils Relative: 1 % (ref 0.0–3.0)
Eosinophils Absolute: 0.3 10*3/uL (ref 0.0–0.7)
Eosinophils Relative: 4.3 % (ref 0.0–5.0)
HCT: 42 % (ref 36.0–46.0)
Hemoglobin: 14.1 g/dL (ref 12.0–15.0)
Lymphocytes Relative: 26 % (ref 12.0–46.0)
Lymphs Abs: 1.5 10*3/uL (ref 0.7–4.0)
MCHC: 33.5 g/dL (ref 30.0–36.0)
MCV: 91.4 fl (ref 78.0–100.0)
Monocytes Absolute: 0.4 10*3/uL (ref 0.1–1.0)
Monocytes Relative: 6.8 % (ref 3.0–12.0)
Neutro Abs: 3.6 10*3/uL (ref 1.4–7.7)
Neutrophils Relative %: 61.9 % (ref 43.0–77.0)
Platelets: 241 10*3/uL (ref 150.0–400.0)
RBC: 4.6 Mil/uL (ref 3.87–5.11)
RDW: 13 % (ref 11.5–15.5)
WBC: 5.9 10*3/uL (ref 4.0–10.5)

## 2022-01-03 LAB — VITAMIN B12: Vitamin B-12: 962 pg/mL — ABNORMAL HIGH (ref 211–911)

## 2022-01-03 LAB — COMPREHENSIVE METABOLIC PANEL
ALT: 18 U/L (ref 0–35)
AST: 19 U/L (ref 0–37)
Albumin: 4.2 g/dL (ref 3.5–5.2)
Alkaline Phosphatase: 66 U/L (ref 39–117)
BUN: 11 mg/dL (ref 6–23)
CO2: 28 mEq/L (ref 19–32)
Calcium: 9.2 mg/dL (ref 8.4–10.5)
Chloride: 104 mEq/L (ref 96–112)
Creatinine, Ser: 0.85 mg/dL (ref 0.40–1.20)
GFR: 81.03 mL/min (ref >60.00)
Glucose, Bld: 79 mg/dL (ref 70–99)
Potassium: 4 mEq/L (ref 3.5–5.1)
Sodium: 139 mEq/L (ref 135–145)
Total Bilirubin: 0.6 mg/dL (ref 0.2–1.2)
Total Protein: 6.7 g/dL (ref 6.0–8.3)

## 2022-01-03 LAB — LIPASE: Lipase: 22 U/L (ref 11.0–59.0)

## 2022-01-03 LAB — TSH: TSH: 1.67 u[IU]/mL (ref 0.35–5.50)

## 2022-01-03 LAB — T4, FREE: Free T4: 0.88 ng/dL (ref 0.60–1.60)

## 2022-01-03 LAB — HEMOGLOBIN A1C: Hgb A1c MFr Bld: 5.3 % (ref 4.6–6.5)

## 2022-01-03 LAB — GAMMA GT: GGT: 26 U/L (ref 7–51)

## 2022-01-03 MED ORDER — FUROSEMIDE 20 MG PO TABS: 10.0000 mg | ORAL_TABLET | ORAL | 1 refills | Status: AC | PRN

## 2022-01-03 MED ORDER — FEXOFENADINE HCL 180 MG PO TABS: 180.0000 mg | ORAL_TABLET | Freq: Every day | ORAL | 3 refills | Status: DC

## 2022-01-03 NOTE — Patient Instructions (Signed)
A prescription for Lasix was sent to your pharmacy.  You can take half a tab as needed for increased swelling.  Prescription for Allegra, an allergy medication was also sent to your pharmacy.  See if this makes a difference in your coughing.  Another possible cause of your coughing is silent reflux.  For some people they do not have overt acid reflux symptoms but have things like a cough, hoarse voice/irritated throat.  Consider taking an antiacid medication to see if you notice improvement in your cough.

## 2022-01-03 NOTE — Progress Notes (Signed)
Subjective:     Brandi Sutton is a 46 y.o. female and is here for a comprehensive physical exam. The patient reports continued edema in extremities.  Patient stopped Lasix after 3 days as she states it hurt her kidneys.  Patient endorses continued fatigue. Tried taking OTC vitamin B-1, felt less tired but caused hives.  OTC vitamin B12 2000 IUs made patient feel jittery/anxious, caused insomnia as she cannot shut her brain off at night.   Trying to get in with Rheumatology, but appts are months out.  Having generalized abdominal pain.  Unsure if related to typical mixed IBS symptoms.  Patient endorses increased allergy symptoms and cough.  Pt s/p hysterectomy in 2020.  Social History   Socioeconomic History   Marital status: Married    Spouse name: Not on file   Number of children: 1   Years of education: Not on file   Highest education level: 12th grade  Occupational History   Occupation: Chartered certified accountant  Tobacco Use   Smoking status: Former    Types: Cigarettes    Quit date: 12/09/2005    Years since quitting: 16.0   Smokeless tobacco: Never  Vaping Use   Vaping Use: Never used  Substance and Sexual Activity   Alcohol use: Yes    Alcohol/week: 0.0 standard drinks    Comment: 1 beers per week liquor 1x week    Drug use: No   Sexual activity: Yes    Birth control/protection: None  Other Topics Concern   Not on file  Social History Narrative   Right Handed   Lives in a one story home with a basement. Has to walk up a flight of stairs to walk up to her office.    Social Determinants of Health   Financial Resource Strain: Low Risk    Difficulty of Paying Living Expenses: Not very hard  Food Insecurity: No Food Insecurity   Worried About Charity fundraiser in the Last Year: Never true   Ran Out of Food in the Last Year: Never true  Transportation Needs: No Transportation Needs   Lack of Transportation (Medical): No   Lack of Transportation (Non-Medical): No  Physical  Activity: Unknown   Days of Exercise per Week: 0 days   Minutes of Exercise per Session: Not on file  Stress: No Stress Concern Present   Feeling of Stress : Only a little  Social Connections: Moderately Integrated   Frequency of Communication with Friends and Family: More than three times a week   Frequency of Social Gatherings with Friends and Family: Once a week   Attends Religious Services: 1 to 4 times per year   Active Member of [employer] or Organizations: No   Attends Music therapist: Not on file   Marital Status: Married  Human resources officer Violence: Not on file   Health Maintenance  Topic Date Due   COVID-19 Vaccine (1) Never done   HIV Screening  Never done   PAP SMEAR-Modifier  08/11/2020   COLONOSCOPY (Pts 45-11yrs Insurance coverage will need to be confirmed)  06/20/2021   INFLUENZA VACCINE  03/11/2022   TETANUS/TDAP  03/04/2027   Hepatitis C Screening  Completed   HPV VACCINES  Aged Out    The following portions of the patient's history were reviewed and updated as appropriate: allergies, current medications, past family history, past medical history, past social history, past surgical history, and problem list.  Review of Systems Pertinent items noted in HPI and remainder of  comprehensive ROS otherwise negative.   Objective:    Temp 98.1 F (36.7 C) (Oral)   Ht 5' 5.25" (1.657 m)   Wt 202 lb (91.6 kg)   LMP 03/12/2011   BMI 33.36 kg/m  General appearance: alert, cooperative, and no distress Head: Normocephalic, without obvious abnormality, atraumatic Eyes: conjunctivae/corneas clear. PERRL, EOM's intact. Fundi benign. Ears: normal TM's and external ear canals both ears Nose: Nares normal. Septum midline. Mucosa normal. No drainage or sinus tenderness. Throat: lips, mucosa, and tongue normal; teeth and gums normal Neck: no adenopathy, no carotid bruit, no JVD, supple, symmetrical, trachea midline, and thyroid not enlarged, symmetric, no  tenderness/mass/nodules Lungs: clear to auscultation bilaterally Heart: regular rate and rhythm, S1, S2 normal, no murmur, click, rub or gallop Abdomen: soft, non-tender; bowel sounds normal; no masses,  no organomegaly Extremities: extremities normal, atraumatic, no cyanosis or edema Pulses: 2+ and symmetric Skin: Skin color, texture, turgor normal. No rashes or lesions Lymph nodes: Cervical, supraclavicular, and axillary nodes normal. Neurologic: Alert and oriented X 3, normal strength and tone. Normal symmetric reflexes. Normal coordination and gait    Assessment:    Healthy female exam.      Plan:    Anticipatory guidance given including wearing seatbelts, smoke detectors in the home, increasing physical activity, increasing p.o. intake of water and vegetables. -Obtain labs -Pt schedule mammogram -Colonoscopy done 06/20/2016 with 10-year recall.  Repeat in 2027. -Immunizations reviewed -Given handout -Next CPE in 1 year See After Visit Summary for Counseling Recommendations   Chronic cough -Likely 2/2 postnasal drainage from seasonal allergies.  Also consider GERD. -Start allergy medication.  If no improvement in symptoms consider switching allergy medications versus trying PPI. - Plan: fexofenadine (ALLEGRA) 180 MG tablet  Generalized edema -Discussed possible causes including autoimmune d/o, fluid retention, decreased protein intake or increased sodium -Continue supportive care including lifestyle modifications, elevating LEs when sitting, compression socks or TED hose. - Plan: CBC with Differential/Platelet, TSH, T4, Free, CMP, furosemide (LASIX) 20 MG tablet  Generalized abdominal pain -May be related to history of IBS -Given strict precautions -We will have patient follow-up with GI for continued or worsening symptoms - Plan: Hemoglobin A1c, Lipid panel, CMP, Lipase, Gamma GT  Irritable bowel syndrome with both constipation and diarrhea -Lifestyle  modifications -Levsin SL - Plan: CBC with Differential/Platelet, TSH, T4, Free, Vitamin B12  Seasonal allergies  - Plan: fexofenadine (ALLEGRA) 180 MG tablet  Follow-up in 1 month as needed  Grier Mitts, MD

## 2022-01-07 ENCOUNTER — Encounter: Payer: Self-pay | Admitting: Family Medicine

## 2022-01-14 ENCOUNTER — Encounter: Payer: Self-pay | Admitting: Family Medicine

## 2022-01-14 ENCOUNTER — Other Ambulatory Visit: Payer: Self-pay | Admitting: Family Medicine

## 2022-01-14 DIAGNOSIS — R601 Generalized edema: Secondary | ICD-10-CM

## 2022-01-14 DIAGNOSIS — M2550 Pain in unspecified joint: Secondary | ICD-10-CM

## 2022-01-14 DIAGNOSIS — R899 Unspecified abnormal finding in specimens from other organs, systems and tissues: Secondary | ICD-10-CM

## 2022-02-10 ENCOUNTER — Ambulatory Visit: Payer: BC Managed Care – PPO | Admitting: Family Medicine

## 2022-02-10 ENCOUNTER — Encounter: Payer: Self-pay | Admitting: Family Medicine

## 2022-02-10 VITALS — BP 120/78 | HR 92 | Temp 97.8°F | Wt 207.8 lb

## 2022-02-10 DIAGNOSIS — L309 Dermatitis, unspecified: Secondary | ICD-10-CM

## 2022-02-10 DIAGNOSIS — J302 Other seasonal allergic rhinitis: Secondary | ICD-10-CM | POA: Diagnosis not present

## 2022-02-10 DIAGNOSIS — R601 Generalized edema: Secondary | ICD-10-CM

## 2022-02-10 MED ORDER — TRIAMCINOLONE ACETONIDE 0.5 % EX OINT: 1.0000 | TOPICAL_OINTMENT | Freq: Two times a day (BID) | CUTANEOUS | 0 refills | Status: AC

## 2022-02-10 NOTE — Progress Notes (Signed)
Subjective:    Patient ID: Brandi Sutton, female    DOB: 11-Apr-1976, 46 y.o.   MRN: [id number]  Chief Complaint  Patient presents with   Follow-up    Follow up on allergy medication, and swelling. Pt reports the Rx for allergy help but med for swelling did not helps.     HPI Patient was seen today for f/u.  Pt states she has had a rash on posterior neck, right shoulder, lower back, lower left abdomen x5 weeks.  States started shortly after her last appointment.  Skin is pruritic, dry feeling, with occasional burning sensation.  Patient denies changes in soaps, lotions, detergents.  Has not been out in the sun much.  Tried OTC cortisone cream without relief.  Patient's husband does not have a rash.  Patient has pets but does not think they have been in contact with any poisonous vines.  Patient did notice a poison vine in front yard, but has not been in contact with it.  Pt still having LE edema, joint pain.  Has appointment with rheumatology next week for positive thyroid peroxidase antibody, parietal cell antibody.  Patient has not noticed much improvement with Lasix.  Patient notes improvement in symptoms since starting Allegra for allergies.  Past Medical History:  Diagnosis Date   Allergy    ASTHMA 03/31/2007   no attack in years per patient    Asthma    allergy related   Breast mass, left    CONTACT DERMATITIS&OTHER ECZEMA DUE TO PLANTS 12/03/2009   Depression    DYSFUNCTIONAL UTERINE BLEEDING 01/24/2010   GERD (gastroesophageal reflux disease)    Headache    sinus headaches    HIP PAIN, RIGHT 01/22/2009   LOW BACK PAIN, ACUTE 04/17/2008    Allergies  Allergen Reactions   Penicillins Hives and Itching    Has patient had a PCN reaction causing immediate rash, facial/tongue/throat swelling, SOB or lightheadedness with hypotension: no Has patient had a PCN reaction causing severe rash involving mucus membranes or skin necrosis: no Has patient had a PCN reaction that required  hospitalization no Has patient had a PCN reaction occurring within the last 10 years: yes If all of the above answers are "NO", then may proceed with Cephalosporin use.    Bupropion Hcl Hives and Rash   Sulfa Antibiotics Rash    ROS General: Denies fever, chills, night sweats, changes in weight, changes in appetite HEENT: Denies headaches, ear pain, changes in vision, rhinorrhea, sore throat CV: Denies CP, palpitations, SOB, orthopnea  + generalized edema Pulm: Denies SOB, cough, wheezing GI: Denies abdominal pain, nausea, vomiting, diarrhea, constipation GU: Denies dysuria, hematuria, frequency, vaginal discharge Msk: Denies muscle cramps,+ joint pains Neuro: Denies weakness, numbness, tingling Skin: Denies rashes, bruising  + pruritic rash Psych: Denies depression, anxiety, hallucinations    Objective:    Blood pressure 120/78, pulse 92, temperature 97.8 F (36.6 C), temperature source Oral, weight 207 lb 12.8 oz (94.3 kg), last menstrual period 03/12/2011, SpO2 97 %.  Gen. Pleasant, well-nourished, in no distress, normal affect   HEENT: [place]/AT, face symmetric, conjunctiva clear, no scleral icterus, PERRLA, EOMI, nares patent without drainage Lungs: no accessory muscle use, CTAB, no wheezes or rales Cardiovascular: RRR, no m/r/g, edema bilateral ankles Musculoskeletal: No deformities, no cyanosis or clubbing, normal tone Neuro:  A&Ox3, CN II-XII intact, normal gait Skin:  Warm, dry, intact.  Posterior neck and right shoulder with healing lesions few with crusting and faint erythema.  Faint erythematous healing areas  on midline low back and left lower abdomen.  Excoriations noted from scratching.  No pustules or vesicles noted.   Wt Readings from Last 3 Encounters:  02/10/22 207 lb 12.8 oz (94.3 kg)  01/03/22 202 lb (91.6 kg)  11/04/21 201 lb (91.2 kg)    Lab Results  Component Value Date   WBC 5.9 01/03/2022   HGB 14.1 01/03/2022   HCT 42.0 01/03/2022   PLT 241.0  01/03/2022   GLUCOSE 79 01/03/2022   CHOL 183 01/03/2022   TRIG 121.0 01/03/2022   HDL 50.50 01/03/2022   LDLCALC 108 (H) 01/03/2022   ALT 18 01/03/2022   AST 19 01/03/2022   NA 139 01/03/2022   K 4.0 01/03/2022   CL 104 01/03/2022   CREATININE 0.85 01/03/2022   BUN 11 01/03/2022   CO2 28 01/03/2022   TSH 1.67 01/03/2022   HGBA1C 5.3 01/03/2022    Assessment/Plan:  Dermatitis -Nonspecific -Rash on posterior neck and right shoulder similar in appearance to healing shingles lesions.  Lesions elsewhere different in appearance. -Consider contact dermatitis from poisonous plant.  Possible autoimmune rash.  Less likely from products as patient has not changed soaps, lotions, detergents. -Discussed continuing to use gentle products.  Patient encouraged to avoid scratching as may cause secondary infection from bacteria on fingernails -We will start triamcinolone ointment  - Plan: triamcinolone ointment (KENALOG) 0.5 %  Seasonal allergies -Continue Allegra as needed  Generalized edema -Continue supportive care -Encouraged to keep upcoming rheumatology appointment  F/u as needed  Grier Mitts, MD

## 2022-02-17 DIAGNOSIS — R899 Unspecified abnormal finding in specimens from other organs, systems and tissues: Secondary | ICD-10-CM | POA: Diagnosis not present

## 2022-02-17 DIAGNOSIS — R609 Edema, unspecified: Secondary | ICD-10-CM | POA: Diagnosis not present

## 2022-02-17 DIAGNOSIS — M2550 Pain in unspecified joint: Secondary | ICD-10-CM | POA: Diagnosis not present

## 2022-02-18 ENCOUNTER — Other Ambulatory Visit: Payer: Self-pay | Admitting: Family Medicine

## 2022-02-18 DIAGNOSIS — Z1231 Encounter for screening mammogram for malignant neoplasm of breast: Secondary | ICD-10-CM

## 2022-02-24 ENCOUNTER — Ambulatory Visit
Admission: RE | Admit: 2022-02-24 | Discharge: 2022-02-24 | Disposition: A | Discharge status: Home / Self Care | Payer: BC Managed Care – PPO | Source: Ambulatory Visit

## 2022-02-24 DIAGNOSIS — Z1231 Encounter for screening mammogram for malignant neoplasm of breast: Secondary | ICD-10-CM | POA: Diagnosis not present

## 2022-03-14 DIAGNOSIS — L738 Other specified follicular disorders: Secondary | ICD-10-CM | POA: Diagnosis not present

## 2022-03-14 DIAGNOSIS — D485 Neoplasm of uncertain behavior of skin: Secondary | ICD-10-CM | POA: Diagnosis not present

## 2022-03-27 ENCOUNTER — Ambulatory Visit: Payer: BC Managed Care – PPO | Admitting: Rheumatology

## 2022-03-28 DIAGNOSIS — L309 Dermatitis, unspecified: Secondary | ICD-10-CM | POA: Diagnosis not present

## 2022-03-28 DIAGNOSIS — L821 Other seborrheic keratosis: Secondary | ICD-10-CM | POA: Diagnosis not present

## 2022-04-23 ENCOUNTER — Ambulatory Visit: Payer: BC Managed Care – PPO | Admitting: Rheumatology

## 2022-05-04 ENCOUNTER — Emergency Department: Payer: BC Managed Care – PPO

## 2022-05-04 ENCOUNTER — Emergency Department
Admission: EM | Admit: 2022-05-04 | Discharge: 2022-05-04 | Disposition: A | Discharge status: Home / Self Care | Payer: BC Managed Care – PPO | Attending: Emergency Medicine | Admitting: Emergency Medicine

## 2022-05-04 DIAGNOSIS — K5732 Diverticulitis of large intestine without perforation or abscess without bleeding: Secondary | ICD-10-CM | POA: Diagnosis not present

## 2022-05-04 DIAGNOSIS — K5730 Diverticulosis of large intestine without perforation or abscess without bleeding: Secondary | ICD-10-CM | POA: Diagnosis not present

## 2022-05-04 DIAGNOSIS — R1030 Lower abdominal pain, unspecified: Secondary | ICD-10-CM | POA: Diagnosis not present

## 2022-05-04 LAB — URINALYSIS, ROUTINE W REFLEX MICROSCOPIC
Bilirubin Urine: NEGATIVE
Glucose, UA: NEGATIVE mg/dL
Hgb urine dipstick: NEGATIVE
Leukocytes,Ua: NEGATIVE
Nitrite: NEGATIVE
Protein, ur: NEGATIVE mg/dL
Specific Gravity, Urine: 1.025 (ref 1.005–1.030)
pH: 6 (ref 5.0–8.0)

## 2022-05-04 LAB — COMPREHENSIVE METABOLIC PANEL
ALT: 19 U/L (ref 0–44)
AST: 19 U/L (ref 15–41)
Albumin: 4.1 g/dL (ref 3.5–5.0)
Alkaline Phosphatase: 72 U/L (ref 38–126)
Anion gap: 6 (ref 5–15)
BUN: 9 mg/dL (ref 6–20)
CO2: 25 mmol/L (ref 22–32)
Calcium: 9.3 mg/dL (ref 8.9–10.3)
Chloride: 108 mmol/L (ref 98–111)
Creatinine, Ser: 0.8 mg/dL (ref 0.44–1.00)
GFR, Estimated: >60 mL/min (ref >60)
Glucose, Bld: 107 mg/dL — ABNORMAL HIGH (ref 70–99)
Potassium: 3.5 mmol/L (ref 3.5–5.1)
Sodium: 139 mmol/L (ref 135–145)
Total Bilirubin: 0.6 mg/dL (ref 0.3–1.2)
Total Protein: 7.2 g/dL (ref 6.5–8.1)

## 2022-05-04 LAB — CBC
HCT: 43.5 % (ref 36.0–46.0)
Hemoglobin: 14.6 g/dL (ref 12.0–15.0)
MCH: 30.5 pg (ref 26.0–34.0)
MCHC: 33.6 g/dL (ref 30.0–36.0)
MCV: 91 fL (ref 80.0–100.0)
Platelets: 276 10*3/uL (ref 150–400)
RBC: 4.78 MIL/uL (ref 3.87–5.11)
RDW: 11.9 % (ref 11.5–15.5)
WBC: 10.8 10*3/uL — ABNORMAL HIGH (ref 4.0–10.5)
nRBC: 0 % (ref 0.0–0.2)

## 2022-05-04 LAB — LIPASE, BLOOD: Lipase: 36 U/L (ref 11–51)

## 2022-05-04 MED ORDER — HALOPERIDOL LACTATE 5 MG/ML IJ SOLN
5.0000 mg | Freq: Once | INTRAMUSCULAR | Status: AC
  Administered 2022-05-04: 5 mg via INTRAVENOUS
  Filled 2022-05-04: qty 1

## 2022-05-04 MED ORDER — IOHEXOL 300 MG/ML  SOLN
100.0000 mL | Freq: Once | INTRAMUSCULAR | Status: AC | PRN
  Administered 2022-05-04: 100 mL via INTRAVENOUS

## 2022-05-04 MED ORDER — HYDROCODONE-ACETAMINOPHEN 5-325 MG PO TABS: 1.0000 | ORAL_TABLET | Freq: Four times a day (QID) | ORAL | 0 refills | Status: DC | PRN

## 2022-05-04 MED ORDER — MOXIFLOXACIN HCL 400 MG PO TABS: 400.0000 mg | ORAL_TABLET | Freq: Every day | ORAL | 0 refills | Status: AC

## 2022-05-04 MED ORDER — SODIUM CHLORIDE 0.9 % IV BOLUS
1000.0000 mL | Freq: Once | INTRAVENOUS | Status: AC
  Administered 2022-05-04: 1000 mL via INTRAVENOUS

## 2022-05-04 NOTE — ED Provider Notes (Addendum)
[facility] Provider Note    Event Date/Time   First MD Initiated Contact with Patient 05/04/22 2015     (approximate)   History   Abdominal Pain and Back Pain   HPI  Brandi Sutton is a 46 y.o. female  who presents to the emergency department because of concern for low back pain and abdominal pain. The patient stated that her low back pain started roughly 1 week ago, primarily in the right lower back. She then started having lower abdominal pain 2 days ago. She states that the abdominal pain will be sharp in nature and come in spasms. She has noticed some loose stool. She denies any change in urination. Denies any fevers. States she had similar pain once in the past and had seen GI and at one point they felt it could be due to diverticulitis.    Physical Exam   Triage Vital Signs: ED Triage Vitals  Enc Vitals Group     BP 05/04/22 1851 (!) 140/91     Pulse Rate 05/04/22 1851 (!) 102     Resp 05/04/22 1851 16     Temp 05/04/22 1851 98.3 F (36.8 C)     Temp Source 05/04/22 1851 Oral     SpO2 05/04/22 1851 96 %     Weight 05/04/22 1853 200 lb (90.7 kg)     Height 05/04/22 1853 5\' 6"  (1.676 m)     Head Circumference --      Peak Flow --      Pain Score 05/04/22 1853 3     Pain Loc --      Pain Edu? --      Excl. in [place]? --     Most recent vital signs: Vitals:   05/04/22 1851  BP: (!) 140/91  Pulse: (!) 102  Resp: 16  Temp: 98.3 F (36.8 C)  SpO2: 96%   General: Awake, alert, oriented. CV:  Good peripheral perfusion. Regular rate and rhythm. Resp:  Normal effort. Lungs clear. Abd:  Somewhat diffusely tender to palpation, worse in lower abdomen.    ED Results / Procedures / Treatments   Labs (all labs ordered are listed, but only abnormal results are displayed) Labs Reviewed  COMPREHENSIVE METABOLIC PANEL - Abnormal; Notable for the following components:      Result Value   Glucose, Bld 107 (*)    All other components within normal  limits  CBC - Abnormal; Notable for the following components:   WBC 10.8 (*)    All other components within normal limits  URINALYSIS, ROUTINE W REFLEX MICROSCOPIC - Abnormal; Notable for the following components:   APPearance CLEAR (*)    Ketones, ur TRACE (*)    Bacteria, UA RARE (*)    All other components within normal limits  LIPASE, BLOOD     EKG  None   RADIOLOGY I independently interpreted and visualized the CT abd/pel. My interpretation: No free air Radiology interpretation:  IMPRESSION:  Colonic diverticulosis with uncomplicated acute mid sigmoid  diverticulitis. Consider colonoscopy status post treatment and  status post complete resolution of inflammatory changes to exclude  an underlying lesion.     PROCEDURES:  Critical Care performed: No  Procedures   MEDICATIONS ORDERED IN ED: Medications - No data to display   IMPRESSION / MDM / [place] / ED COURSE  I reviewed the triage vital signs and the nursing notes.  Differential diagnosis includes, but is not limited to, UTI, kidney stone, diverticulitis.  Patient's presentation is most consistent with acute presentation with potential threat to life or bodily function.  Patient presented to the emergency department today because of concerns for both back and abdominal pain.  On exam patient is somewhat tender to palpation in the lower abdomen.  Blood work without any significant leukocytosis.  UA is not consistent with infection.  CT scan does show uncomplicated diverticulitis.  Discussed this finding with the patient.  Will start on antibiotics. Did discuss with patient importance of follow up with her GI doctor and potentially undergoing colonoscopy.  FINAL CLINICAL IMPRESSION(S) / ED DIAGNOSES   Final diagnoses:  Diverticulitis of colon      Note:  This document was prepared using Dragon voice recognition software and may include unintentional dictation  errors.    Nance Pear, MD 05/04/22 [id number]    Nance Pear, MD 05/04/22 2221

## 2022-05-04 NOTE — ED Triage Notes (Signed)
Pt presents to ED from home C/O lower back pain X 1 week, abdominal pain and cramping since Friday.

## 2022-05-04 NOTE — Discharge Instructions (Addendum)
Please seek medical attention for any high fevers, chest pain, shortness of breath, change in behavior, persistent vomiting, bloody stool or any other new or concerning symptoms.  

## 2022-05-14 ENCOUNTER — Other Ambulatory Visit: Payer: Self-pay | Admitting: Family Medicine

## 2022-05-14 DIAGNOSIS — J302 Other seasonal allergic rhinitis: Secondary | ICD-10-CM

## 2022-05-14 DIAGNOSIS — R053 Chronic cough: Secondary | ICD-10-CM

## 2022-05-27 DIAGNOSIS — M2550 Pain in unspecified joint: Secondary | ICD-10-CM | POA: Diagnosis not present

## 2022-05-27 DIAGNOSIS — R600 Localized edema: Secondary | ICD-10-CM | POA: Diagnosis not present

## 2022-06-10 DIAGNOSIS — S86112A Strain of other muscle(s) and tendon(s) of posterior muscle group at lower leg level, left leg, initial encounter: Secondary | ICD-10-CM | POA: Diagnosis not present

## 2022-06-10 DIAGNOSIS — M7661 Achilles tendinitis, right leg: Secondary | ICD-10-CM | POA: Diagnosis not present

## 2022-06-10 DIAGNOSIS — R600 Localized edema: Secondary | ICD-10-CM | POA: Diagnosis not present

## 2022-06-10 DIAGNOSIS — M792 Neuralgia and neuritis, unspecified: Secondary | ICD-10-CM | POA: Diagnosis not present

## 2022-06-26 DIAGNOSIS — M7661 Achilles tendinitis, right leg: Secondary | ICD-10-CM | POA: Diagnosis not present

## 2022-06-26 DIAGNOSIS — M24571 Contracture, right ankle: Secondary | ICD-10-CM | POA: Diagnosis not present

## 2022-06-30 DIAGNOSIS — D225 Melanocytic nevi of trunk: Secondary | ICD-10-CM | POA: Diagnosis not present

## 2022-06-30 DIAGNOSIS — Z85828 Personal history of other malignant neoplasm of skin: Secondary | ICD-10-CM | POA: Diagnosis not present

## 2022-06-30 DIAGNOSIS — D2262 Melanocytic nevi of left upper limb, including shoulder: Secondary | ICD-10-CM | POA: Diagnosis not present

## 2022-06-30 DIAGNOSIS — D2261 Melanocytic nevi of right upper limb, including shoulder: Secondary | ICD-10-CM | POA: Diagnosis not present

## 2022-07-08 ENCOUNTER — Other Ambulatory Visit: Payer: BC Managed Care – PPO

## 2022-07-08 ENCOUNTER — Encounter: Payer: Self-pay | Admitting: Gastroenterology

## 2022-07-08 ENCOUNTER — Ambulatory Visit: Payer: BC Managed Care – PPO | Admitting: Gastroenterology

## 2022-07-08 VITALS — BP 118/76 | HR 79 | Ht 66.0 in | Wt 154.0 lb

## 2022-07-08 DIAGNOSIS — K5732 Diverticulitis of large intestine without perforation or abscess without bleeding: Secondary | ICD-10-CM

## 2022-07-08 DIAGNOSIS — K582 Mixed irritable bowel syndrome: Secondary | ICD-10-CM

## 2022-07-08 NOTE — Patient Instructions (Signed)
_______________________________________________________  If you are age 48 or older, your body mass index should be between 23-30. Your Body mass index is 24.86 kg/m. If this is out of the aforementioned range listed, please consider follow up with your Primary Care Provider.  If you are age 73 or younger, your body mass index should be between 19-25. Your Body mass index is 24.86 kg/m. If this is out of the aformentioned range listed, please consider follow up with your Primary Care Provider.   ________________________________________________________  The [facility] providers would like to encourage you to use [facility] to communicate with providers for non-urgent requests or questions.  Due to long hold times on the telephone, sending your provider a message by [facility] may be a faster and more efficient way to get a response.  Please allow 48 business hours for a response.  Please remember that this is for non-urgent requests.  _______________________________________________________  Your provider has requested that you go to the basement level for lab work before leaving today. Press "B" on the elevator. The lab is located at the first door on the left as you exit the elevator.

## 2022-07-08 NOTE — Progress Notes (Signed)
[facility] progress note:  History: Brandi Sutton 07/08/2022  Referring provider: Billie Ruddy, MD  Reason for consult/chief complaint: Diverticulitis (Pt states she is doing well today)   Subjective  HPI: Brandi Sutton is here to follow-up an emergency department visit for diverticulitis in September. I last saw her in March 2022 for IBS with alternating bowel habits (primarily constipation) and episodes of more acute abdominal pain believed to be diverticulitis and usually treated empirically with symptomatic improvement. ( February 2022.)  At that visit she was given samples of Linzess at different doses to try.  March 2022 call the office with pelvic pain thinking this may be diverticulitis.  CT abdomen and pelvis showed diverticulosis as expected without diverticulitis.  Levsin prescribed.  Brandi Sutton was in the ED 05/04/2022 with few days of lower abdominal and pelvic pain and tenderness -CT reportedly showed diverticulitis (see below), and she was treated with moxifloxacin (listed allergies to penicillin and sulfa)  Sahian says she started feeling better within about 2 days of the antibiotics prescribed at the ED visit, and she has been back to baseline since then.  She still has alternating constipation and diarrhea that she has come to live with.  The Linzess was not especially helpful as it only gave her more loose stool.  She has bloating and gas, more with certain foods.   Last colonoscopy November 2017, diverticulosis noted, no polyps, 10-year recall recommended.  ROS:  Review of Systems  Denies chest pain or dyspnea  She went to her dermatologist for a rash on the back of her neck that they thought might be dermatitis herpetiformis and gave her some ointment.  They recommended she bring this up in case it may be indicative of underlying celiac.  Back pain  Remainder of systems negative except as above  Past Medical History: Past Medical  History:  Diagnosis Date   Allergy    ASTHMA 03/31/2007   no attack in years per patient    Asthma    allergy related   Breast mass, left    CONTACT DERMATITIS&OTHER ECZEMA DUE TO PLANTS 12/03/2009   Depression    DYSFUNCTIONAL UTERINE BLEEDING 01/24/2010   GERD (gastroesophageal reflux disease)    Headache    sinus headaches    HIP PAIN, RIGHT 01/22/2009   LOW BACK PAIN, ACUTE 04/17/2008     Past Surgical History: Past Surgical History:  Procedure Laterality Date   BREAST BIOPSY Right    BREAST EXCISIONAL BIOPSY Left 2017   CHOLECYSTECTOMY N/A 12/25/2015   Procedure: LAPAROSCOPIC CHOLECYSTECTOMY;  Surgeon: Rolm Bookbinder, MD;  Location: [facility];  Service: General;  Laterality: N/A;   LAPAROSCOPIC VAGINAL HYSTERECTOMY WITH SALPINGECTOMY Bilateral 10/14/2018   Procedure: LAPAROSCOPIC ASSISTED VAGINAL HYSTERECTOMY WITH SALPINGECTOMY;  Surgeon: Olga Millers, MD;  Location: [facility];  Service: Gynecology;  Laterality: Bilateral;   RADIOACTIVE SEED GUIDED EXCISIONAL BREAST BIOPSY Left 07/14/2016   Procedure: RADIOACTIVE SEED GUIDED EXCISIONAL BREAST BIOPSY;  Surgeon: Rolm Bookbinder, MD;  Location: [place];  Service: General;  Laterality: Left;   TONSILLECTOMY AND ADENOIDECTOMY     uterine abalation     WISDOM TOOTH EXTRACTION       Family History: Family History  Problem Relation Age of Onset   Depression Mother    Diabetes Mother        diet controlled   Transient ischemic attack Mother    Colon polyps Mother        pre-cancerous  Hypertension Father    Diabetes Father        diet controlled   Bowel Disease Father        blockage, had to wear a bag for awhile   Parkinson's disease Father    Stroke Maternal Grandfather    Lung cancer Paternal Grandfather     Social History: Social History   Socioeconomic History   Marital status: Married    Spouse name: Not on file   Number of children: 1   Years of education: Not on  file   Highest education level: 12th grade  Occupational History   Occupation: Chartered certified accountant  Tobacco Use   Smoking status: Former    Types: Cigarettes    Quit date: 12/09/2005    Years since quitting: 16.5   Smokeless tobacco: Never  Vaping Use   Vaping Use: Never used  Substance and Sexual Activity   Alcohol use: Yes    Alcohol/week: 0.0 standard drinks of alcohol    Comment: 1 beers per week liquor 1x week    Drug use: No   Sexual activity: Yes    Birth control/protection: None  Other Topics Concern   Not on file  Social History Narrative   Right Handed   Lives in a one story home with a basement. Has to walk up a flight of stairs to walk up to her office.    Social Determinants of Health   Financial Resource Strain: Low Risk  (10/31/2021)   Overall Financial Resource Strain (CARDIA)    Difficulty of Paying Living Expenses: Not very hard  Food Insecurity: No Food Insecurity (10/31/2021)   Hunger Vital Sign    Worried About Running Out of Food in the Last Year: Never true    Ran Out of Food in the Last Year: Never true  Transportation Needs: No Transportation Needs (10/31/2021)   PRAPARE - Hydrologist (Medical): No    Lack of Transportation (Non-Medical): No  Physical Activity: Unknown (10/31/2021)   Exercise Vital Sign    Days of Exercise per Week: 0 days    Minutes of Exercise per Session: Not on file  Stress: No Stress Concern Present (10/31/2021)   [place]    Feeling of Stress : Only a little  Social Connections: Moderately Integrated (10/31/2021)   Social Connection and Isolation Panel [NHANES]    Frequency of Communication with Friends and Family: More than three times a week    Frequency of Social Gatherings with Friends and Family: Once a week    Attends Religious Services: 1 to 4 times per year    Active Member of [employer] or Organizations: No    Attends Programme researcher, broadcasting/film/video: Not on file    Marital Status: Married    Allergies: Allergies  Allergen Reactions   Penicillins Hives and Itching    Has patient had a PCN reaction causing immediate rash, facial/tongue/throat swelling, SOB or lightheadedness with hypotension: no Has patient had a PCN reaction causing severe rash involving mucus membranes or skin necrosis: no Has patient had a PCN reaction that required hospitalization no Has patient had a PCN reaction occurring within the last 10 years: yes If all of the above answers are "NO", then may proceed with Cephalosporin use.    Bupropion Hcl Hives and Rash   Sulfa Antibiotics Rash    Outpatient Meds: Current Outpatient Medications  Medication Sig Dispense Refill   acetaminophen (TYLENOL)  500 MG tablet Take by mouth.     fexofenadine (ALLEGRA) 180 MG tablet TAKE 1 TABLET BY MOUTH EVERY DAY 30 tablet 3   fluticasone (FLONASE) 50 MCG/ACT nasal spray Place into the nose.     furosemide (LASIX) 20 MG tablet Take 0.5 tablets (10 mg total) by mouth as needed (increased edema). 30 tablet 1   hyoscyamine (LEVSIN SL) 0.125 MG SL tablet Place 1 tablet (0.125 mg total) under the tongue every 6 (six) hours as needed. 30 tablet 0   ibuprofen (ADVIL) 100 MG tablet Take by mouth.     lansoprazole (PREVACID) 15 MG capsule Take by mouth.     Melatonin 2.5 MG CHEW Chew 2 each by mouth at bedtime. As needed- gummies for sleep     triamcinolone ointment (KENALOG) 0.5 % Apply 1 Application topically 2 (two) times daily. 30 g 0   HYDROcodone-acetaminophen (NORCO/VICODIN) 5-325 MG tablet Take 1 tablet by mouth every 6 (six) hours as needed for severe pain. (Patient not taking: Reported on 07/08/2022) 12 tablet 0   meloxicam (MOBIC) 15 MG tablet Take by mouth. (Patient not taking: Reported on 07/08/2022)     No current facility-administered medications for this visit.      ___________________________________________________________________ Objective    Exam:  BP 118/76   Pulse 79   Ht 5\' 6"  (1.676 m)   Wt 154 lb (69.9 kg)   LMP 03/12/2011   BMI 24.86 kg/m  Wt Readings from Last 3 Encounters:  07/08/22 154 lb (69.9 kg)  05/04/22 200 lb (90.7 kg)  02/10/22 207 lb 12.8 oz (94.3 kg)    General: Well-appearing Eyes: sclera anicteric, no redness ENT: oral mucosa moist without lesions, no cervical or supraclavicular lymphadenopathy CV: Regular without murmur, no JVD, no peripheral edema Resp: clear to auscultation bilaterally, normal RR and effort noted GI: soft, mild LLQ tenderness, with active bowel sounds. No guarding or palpable organomegaly noted. Skin; warm and dry, no rash or jaundice noted Neuro: awake, alert and oriented x 3. Normal gross motor function and fluent speech  Labs:     Latest Ref Rng & Units 05/04/2022    6:59 PM 01/03/2022    9:57 AM 11/04/2021    3:50 PM  CBC  WBC 4.0 - 10.5 K/uL 10.8  5.9  5.3   Hemoglobin 12.0 - 15.0 g/dL 14.6  14.1  14.0   Hematocrit 36.0 - 46.0 % 43.5  42.0  40.8   Platelets 150 - 400 K/uL 276  241.0  236.0       Latest Ref Rng & Units 05/04/2022    6:59 PM 01/03/2022    9:57 AM 11/04/2021    3:50 PM  CMP  Glucose 70 - 99 mg/dL 107  79  70   BUN 6 - 20 mg/dL 9  11  7    Creatinine 0.44 - 1.00 mg/dL 0.80  0.85  0.84   Sodium 135 - 145 mmol/L 139  139  139   Potassium 3.5 - 5.1 mmol/L 3.5  4.0  3.6   Chloride 98 - 111 mmol/L 108  104  104   CO2 22 - 32 mmol/L 25  28  26    Calcium 8.9 - 10.3 mg/dL 9.3  9.2  9.0   Total Protein 6.5 - 8.1 g/dL 7.2  6.7  6.7   Total Bilirubin 0.3 - 1.2 mg/dL 0.6  0.6  0.4   Alkaline Phos 38 - 126 U/L 72  66  57   AST 15 -  41 U/L 19  19  21    ALT 0 - 44 U/L 19  18  21       Radiologic Studies:  CLINICAL DATA:  abd pain, low back pain   EXAM: CT ABDOMEN AND PELVIS WITH CONTRAST   TECHNIQUE: Multidetector CT imaging of the abdomen and pelvis was performed using the standard protocol following bolus administration of intravenous  contrast.   RADIATION DOSE REDUCTION: This exam was performed according to the departmental dose-optimization program which includes automated exposure control, adjustment of the mA and/or kV according to patient size and/or use of iterative reconstruction technique.   CONTRAST:  160mL OMNIPAQUE IOHEXOL 300 MG/ML  SOLN   COMPARISON:  CT abdomen pelvis 11/20/2020   FINDINGS: Lower chest: No acute abnormality.   Hepatobiliary: No focal liver abnormality. Status post cholecystectomy. No biliary dilatation.   Pancreas: No focal lesion. Normal pancreatic contour. No surrounding inflammatory changes. No main pancreatic ductal dilatation.   Spleen: Normal in size without focal abnormality.   Adrenals/Urinary Tract:   No adrenal nodule bilaterally.   Bilateral kidneys enhance symmetrically.   No hydronephrosis. No hydroureter.   The urinary bladder is unremarkable.   Stomach/Bowel: Stomach is within normal limits. No evidence of bowel wall thickening or dilatation. Mid sigmoid colon bowel wall thickening surrounding a diverticula with associated pericolonic fat stranding (2:72). No intramural abscess formation. Appendix appears normal.   Vascular/Lymphatic: No abdominal aorta or iliac aneurysm. No abdominal, pelvic, or inguinal lymphadenopathy.   Reproductive: Status post hysterectomy. No adnexal masses.   Other: No intraperitoneal free fluid. No intraperitoneal free gas. No organized fluid collection.   Musculoskeletal:   No abdominal wall hernia or abnormality.   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 No suspicious lytic or blastic osseous lesions. No acute displaced fracture. Multilevel degenerative changes of the spine.   IMPRESSION: Colonic diverticulosis with uncomplicated acute mid sigmoid diverticulitis. Consider colonoscopy status post treatment and status post complete resolution of inflammatory  changes to exclude an underlying lesion.     Electronically Signed   By: Iven Finn M.D.   On: 05/04/2022 21:10  (CT images personally reviewed -diverticulitis findings are subtle)  Assessment: Encounter Diagnoses  Name Primary?   Irritable bowel syndrome with both constipation and diarrhea Yes   Diverticulitis of colon     Recent episode of acute diverticulitis resolved with antibiotics, back to baseline of IBS with alternating bowel habits. We discussed how it may remain challenging to determine when she has acute diverticulitis occurring, but she feels that she knows when the pain changes or becomes more intense with the episodes that require antibiotics.  So she will contact me if she feels some plaque that is occurring.  Otherwise, she uses Levsin as needed for IBS and is not due for colonoscopy at this time.  Thank you for the courtesy of this consult.  Please call me with any questions or concerns.  Nelida Meuse III  CC: Referring provider noted above

## 2022-07-10 DIAGNOSIS — M792 Neuralgia and neuritis, unspecified: Secondary | ICD-10-CM | POA: Diagnosis not present

## 2022-07-10 LAB — TISSUE TRANSGLUTAMINASE, IGA: (tTG) Ab, IgA: <1 U/mL

## 2022-07-10 LAB — IGA: Immunoglobulin A: 191 mg/dL (ref 47–310)

## 2022-08-07 DIAGNOSIS — M792 Neuralgia and neuritis, unspecified: Secondary | ICD-10-CM | POA: Diagnosis not present

## 2022-08-07 DIAGNOSIS — M24571 Contracture, right ankle: Secondary | ICD-10-CM | POA: Diagnosis not present

## 2022-08-30 ENCOUNTER — Emergency Department
Admission: EM | Admit: 2022-08-30 | Discharge: 2022-08-30 | Disposition: A | Discharge status: Home / Self Care | Payer: PRIVATE HEALTH INSURANCE | Attending: Emergency Medicine | Admitting: Emergency Medicine

## 2022-08-30 ENCOUNTER — Emergency Department: Payer: PRIVATE HEALTH INSURANCE

## 2022-08-30 ENCOUNTER — Other Ambulatory Visit: Payer: Self-pay

## 2022-08-30 DIAGNOSIS — Y92015 Private garage of single-family (private) house as the place of occurrence of the external cause: Secondary | ICD-10-CM | POA: Diagnosis not present

## 2022-08-30 DIAGNOSIS — S93402A Sprain of unspecified ligament of left ankle, initial encounter: Secondary | ICD-10-CM | POA: Diagnosis not present

## 2022-08-30 DIAGNOSIS — X501XXA Overexertion from prolonged static or awkward postures, initial encounter: Secondary | ICD-10-CM | POA: Diagnosis not present

## 2022-08-30 DIAGNOSIS — M25572 Pain in left ankle and joints of left foot: Secondary | ICD-10-CM | POA: Diagnosis present

## 2022-08-30 DIAGNOSIS — S82892A Other fracture of left lower leg, initial encounter for closed fracture: Secondary | ICD-10-CM | POA: Diagnosis not present

## 2022-08-30 MED ORDER — HYDROCODONE-ACETAMINOPHEN 5-325 MG PO TABS: 1.0000 | ORAL_TABLET | ORAL | 0 refills | Status: AC | PRN

## 2022-08-30 MED ORDER — MELOXICAM 15 MG PO TABS: 15.0000 mg | ORAL_TABLET | Freq: Every day | ORAL | 0 refills | Status: AC

## 2022-08-30 MED ORDER — OXYCODONE-ACETAMINOPHEN 5-325 MG PO TABS
1.0000 | ORAL_TABLET | Freq: Once | ORAL | Status: AC
  Administered 2022-08-30: 1 via ORAL
  Filled 2022-08-30: qty 1

## 2022-08-30 NOTE — ED Triage Notes (Signed)
Pt reports was stepping out the door this am and roller her left ankle. Pt reports painful to walk on at this time.

## 2022-08-30 NOTE — ED Provider Notes (Signed)
[facility] Provider Note  Patient Contact: 10:18 AM (approximate)   History   Ankle Pain   HPI  Brandi Sutton is a 47 y.o. female who presents the emergency department complaining of ankle pain.  Patient was walking down a flight of stairs into her garage carrying boxes when she missed a step, and had an injury to the ankle.  Patient states that she is having significant pain and swelling along the lateral aspect of the ankle over the malleolus.  History of high ankle sprain but never a fracture.  No other injury or complaint.     Physical Exam   Triage Vital Signs: ED Triage Vitals  Enc Vitals Group     BP 08/30/22 1005 110/84     Pulse Rate 08/30/22 1005 88     Resp 08/30/22 1005 20     Temp 08/30/22 1005 98.4 F (36.9 C)     Temp src --      SpO2 08/30/22 1005 98 %     Weight 08/30/22 0957 152 lb 1.9 oz (69 kg)     Height 08/30/22 0957 5\' 6"  (1.676 m)     Head Circumference --      Peak Flow --      Pain Score --      Pain Loc --      Pain Edu? --      Excl. in GC? --     Most recent vital signs: Vitals:   08/30/22 1005  BP: 110/84  Pulse: 88  Resp: 20  Temp: 98.4 F (36.9 C)  SpO2: 98%     General: Alert and in no acute distress.  Cardiovascular:  Good peripheral perfusion Respiratory: Normal respiratory effort without tachypnea or retractions. Lungs CTAB.  Musculoskeletal: Full range of motion to all extremities.  Visualization of the left ankle reveals edema when compared with right.  No open wounds.  Patient is exquisitely tender to palpation over the distal fibula.  Pulse intact.  Sensation intact.  No range of motion is performed at this time. Neurologic:  No gross focal neurologic deficits are appreciated.  Skin:   No rash noted Other:   ED Results / Procedures / Treatments   Labs (all labs ordered are listed, but only abnormal results are displayed) Labs Reviewed - No data to display   EKG     RADIOLOGY  I  personally viewed, evaluated, and interpreted these images as part of my medical decision making, as well as reviewing the written report by the radiologist.  ED Provider Interpretation: Possible avulsion fracture of the distal aspect of the fibula.  No other acute findings.  DG Ankle Complete Left  Result Date: 08/30/2022 CLINICAL DATA:  Ankle injury. EXAM: LEFT ANKLE COMPLETE - 3+ VIEW COMPARISON:  None FINDINGS: There is marked lateral soft tissue swelling. Small curvilinear os ossific density adjacent to the tip of the lateral malleolus is identified compatible with age-indeterminate avulsion injury. No additional fracture or dislocation. Small plantar heel spur. IMPRESSION: 1. Marked lateral soft tissue swelling. 2. Small curvilinear ossific density adjacent to the tip of the lateral malleolus compatible with age-indeterminate avulsion injury. Electronically Signed   By: 09/01/2022 M.D.   On: 08/30/2022 10:57    PROCEDURES:  Critical Care performed: No  Procedures   MEDICATIONS ORDERED IN ED: Medications  oxyCODONE-acetaminophen (PERCOCET/ROXICET) 5-325 MG per tablet 1 tablet (1 tablet Oral Given 08/30/22 1031)     IMPRESSION / MDM / ASSESSMENT AND  PLAN / ED COURSE  I reviewed the triage vital signs and the nursing notes.                                 Differential diagnosis includes, but is not limited to, fracture, ankle sprain   Patient's presentation is most consistent with acute presentation with potential threat to life or bodily function.   Patient's diagnosis is consistent with ankle sprain, avulsion fracture of the distal fibula.  Patient presents to the emergency department complaining of ankle pain.  Patient missed a step, rolled her ankle this morning.  Had edema, tenderness over the lateral malleolus.  She does have a history of high foot sprain in the past.  Imaging today reveals an age indeterminant avulsion fracture at the distal fibula.  At this time patient  be placed in a walking boot, anti-inflammatory.  She has a podiatrist for an unrelated issue with her right ankle.  Patient may follow-up with podiatry.  Return precautions discussed with the patient..  Patient is given ED precautions to return to the ED for any worsening or new symptoms.     FINAL CLINICAL IMPRESSION(S) / ED DIAGNOSES   Final diagnoses:  Sprain of left ankle, unspecified ligament, initial encounter  Closed avulsion fracture of left ankle, initial encounter     Rx / DC Orders   ED Discharge Orders          Ordered    meloxicam (MOBIC) 15 MG tablet  Daily        08/30/22 1133    HYDROcodone-acetaminophen (NORCO/VICODIN) 5-325 MG tablet  Every 4 hours PRN        08/30/22 1133             Note:  This document was prepared using Dragon voice recognition software and may include unintentional dictation errors.   Lanette Hampshire 08/30/22 1134    Chesley Noon, MD 08/30/22 1432

## 2023-08-21 ENCOUNTER — Telehealth: Payer: Self-pay | Admitting: Family Medicine

## 2023-08-21 NOTE — Telephone Encounter (Signed)
 Last visit:  02/10/2022 Called Pt to schedule a CPE/OV.  Pt states she just had surgery on her achilles, and will call us back to schedule in the Spring.

## 2023-10-26 ENCOUNTER — Telehealth: Payer: Self-pay | Admitting: Gastroenterology

## 2023-10-26 MED ORDER — METRONIDAZOLE 500 MG PO TABS: 500.0000 mg | ORAL_TABLET | Freq: Two times a day (BID) | ORAL | 0 refills | Status: DC

## 2023-10-26 MED ORDER — CIPROFLOXACIN HCL 500 MG PO TABS: 500.0000 mg | ORAL_TABLET | Freq: Two times a day (BID) | ORAL | 0 refills | Status: DC

## 2023-10-26 NOTE — Telephone Encounter (Signed)
 Inbound call from patient stating that she believes she is having a diverticulitis flare up and is requesting a call to discuss symptoms and to discuss if she can get medication. Please advise.

## 2023-10-26 NOTE — Telephone Encounter (Signed)
 Thank you for the note, and I reviewed my last office note from November 2023.  I have sent prescriptions to her pharmacy for the following:  Ciprofloxacin 500 mg 1 tablet twice daily for 7 days  Metronidazole 500 mg 1 tablet twice daily for 7 days   Keep the appointment with the PA later this week.  Ellwood Dense MD

## 2023-10-26 NOTE — Telephone Encounter (Signed)
 Patient has pain in the lower center back radiating to the right around to the lower abd.  Pain started 3-4 days ago and seems to be getting worse. Has had diverticulitis in the past and states the pain and discomfort is the same with last diverticulitis flare. She is having normal bowel movements for her.  Some nausea but no vomiting.  Has taken extra strength tylenol every 4-6 hours  with some relief.    She has been given at appt with Shanda Bumps for 3/21 at 11 am. Her last appt with our office was 2023.     The pt would like abx sent in prior to appt; Please advise

## 2023-10-26 NOTE — Telephone Encounter (Signed)
 Spoke with the pt and made her aware that Dr Myrtie Neither has sent prescription to the pharmacy. She will keep appt as scheduled.

## 2023-10-30 ENCOUNTER — Encounter: Payer: Self-pay | Admitting: Gastroenterology

## 2023-10-30 ENCOUNTER — Ambulatory Visit (INDEPENDENT_AMBULATORY_CARE_PROVIDER_SITE_OTHER): Payer: PRIVATE HEALTH INSURANCE | Admitting: Gastroenterology

## 2023-10-30 ENCOUNTER — Ambulatory Visit (INDEPENDENT_AMBULATORY_CARE_PROVIDER_SITE_OTHER): Payer: PRIVATE HEALTH INSURANCE | Admitting: Family Medicine

## 2023-10-30 ENCOUNTER — Encounter: Payer: Self-pay | Admitting: Family Medicine

## 2023-10-30 VITALS — BP 124/82 | HR 105 | Temp 97.7°F | Ht 66.0 in | Wt 219.2 lb

## 2023-10-30 VITALS — BP 118/80 | HR 75 | Ht 66.0 in | Wt 217.0 lb

## 2023-10-30 DIAGNOSIS — R600 Localized edema: Secondary | ICD-10-CM | POA: Diagnosis not present

## 2023-10-30 DIAGNOSIS — L659 Nonscarring hair loss, unspecified: Secondary | ICD-10-CM | POA: Diagnosis not present

## 2023-10-30 DIAGNOSIS — K582 Mixed irritable bowel syndrome: Secondary | ICD-10-CM

## 2023-10-30 DIAGNOSIS — K5732 Diverticulitis of large intestine without perforation or abscess without bleeding: Secondary | ICD-10-CM | POA: Insufficient documentation

## 2023-10-30 DIAGNOSIS — H04129 Dry eye syndrome of unspecified lacrimal gland: Secondary | ICD-10-CM

## 2023-10-30 DIAGNOSIS — K5792 Diverticulitis of intestine, part unspecified, without perforation or abscess without bleeding: Secondary | ICD-10-CM | POA: Diagnosis not present

## 2023-10-30 DIAGNOSIS — Z0000 Encounter for general adult medical examination without abnormal findings: Secondary | ICD-10-CM

## 2023-10-30 DIAGNOSIS — M2550 Pain in unspecified joint: Secondary | ICD-10-CM

## 2023-10-30 MED ORDER — HYOSCYAMINE SULFATE 0.125 MG SL SUBL: 0.1250 mg | SUBLINGUAL_TABLET | Freq: Four times a day (QID) | SUBLINGUAL | 1 refills | Status: AC | PRN

## 2023-10-30 NOTE — Patient Instructions (Addendum)
 We have sent the following medications to your pharmacy for you to pick up at your convenience: Hyoscyamine 0.125mg  take 1 every 6 hours as needed.  Please send a MyChart message if your symptoms continue despite antibiotics.  _______________________________________________________  If your blood pressure at your visit was 140/90 or greater, please contact your primary care physician to follow up on this.  _______________________________________________________  If you are age 50 or older, your body mass index should be between 23-30. Your Body mass index is 35.02 kg/m. If this is out of the aforementioned range listed, please consider follow up with your Primary Care Provider.  If you are age 49 or younger, your body mass index should be between 19-25. Your Body mass index is 35.02 kg/m. If this is out of the aformentioned range listed, please consider follow up with your Primary Care Provider.   ________________________________________________________  The [facility] providers would like to encourage you to use [facility] to communicate with providers for non-urgent requests or questions.  Due to long hold times on the telephone, sending your provider a message by [facility] may be a faster and more efficient way to get a response.  Please allow 48 business hours for a response.  Please remember that this is for non-urgent requests.  _______________________________________________________

## 2023-10-30 NOTE — Progress Notes (Signed)
 Established Patient Office Visit   Subjective  Patient ID: Brandi Sutton, female    DOB: 06-Feb-1976  Age: 48 y.o. MRN: [id number]  Chief Complaint  Patient presents with   Thyroid Problem   Annual Exam    Patient is a 48 year old female seen for CPE.  Patient still dealing with change in hearing, weight gain, bilateral LE edema, joint pain.  Has also noticed dry eyes.  States symptoms are better but still there.  Was seen by rheumatology but testing they are negative.    Patient Active Problem List   Diagnosis Date Noted   Diverticulitis of colon 10/30/2023   Arthritis of shoulder 01/02/2020   History of asthma 01/02/2020   Gastroesophageal reflux disease 01/02/2020   Seasonal allergies 01/02/2020   Chronic pelvic pain in female 10/14/2018   Abdominal pain 06/15/2018   Routine general medical examination at a health care facility 06/15/2018   Allergic rhinitis 07/19/2013   DYSFUNCTIONAL UTERINE BLEEDING 01/24/2010   CONTACT DERMATITIS&OTHER ECZEMA DUE TO PLANTS 12/03/2009   Past Medical History:  Diagnosis Date   Allergy    ASTHMA 03/31/2007   no attack in years per patient    Asthma    allergy related   Breast mass, left    CONTACT DERMATITIS&OTHER ECZEMA DUE TO PLANTS 12/03/2009   Depression    DYSFUNCTIONAL UTERINE BLEEDING 01/24/2010   GERD (gastroesophageal reflux disease)    Headache    sinus headaches    HIP PAIN, RIGHT 01/22/2009   LOW BACK PAIN, ACUTE 04/17/2008   Past Surgical History:  Procedure Laterality Date   ANKLE ARTHROSCOPY W/ INTERNAL FIXATION AND ILIAC CREST BONE GRAFT     BREAST BIOPSY Right    BREAST EXCISIONAL BIOPSY Left 2017   CHOLECYSTECTOMY N/A 12/25/2015   Procedure: LAPAROSCOPIC CHOLECYSTECTOMY;  Surgeon: Emelia Loron, MD;  Location: [facility];  Service: General;  Laterality: N/A;   LAPAROSCOPIC VAGINAL HYSTERECTOMY WITH SALPINGECTOMY Bilateral 10/14/2018   Procedure: LAPAROSCOPIC ASSISTED VAGINAL HYSTERECTOMY WITH SALPINGECTOMY;   Surgeon: Levi Aland, MD;  Location: [facility];  Service: Gynecology;  Laterality: Bilateral;   RADIOACTIVE SEED GUIDED EXCISIONAL BREAST BIOPSY Left 07/14/2016   Procedure: RADIOACTIVE SEED GUIDED EXCISIONAL BREAST BIOPSY;  Surgeon: Emelia Loron, MD;  Location: [facility];  Service: General;  Laterality: Left;   TONSILLECTOMY AND ADENOIDECTOMY     uterine abalation     WISDOM TOOTH EXTRACTION     Social History   Tobacco Use   Smoking status: Former    Current packs/day: 0.00    Types: Cigarettes    Quit date: 12/09/2005    Years since quitting: 17.9   Smokeless tobacco: Never  Vaping Use   Vaping status: Never Used  Substance Use Topics   Alcohol use: Yes    Alcohol/week: 0.0 standard drinks of alcohol    Comment: 1 beers per week liquor 1x week    Drug use: No   Family History  Problem Relation Age of Onset   Depression Mother    Diabetes Mother        diet controlled   Transient ischemic attack Mother    Colon polyps Mother        pre-cancerous   Hypertension Father    Diabetes Father        diet controlled   Bowel Disease Father        blockage, had to wear a bag for awhile   Parkinson's disease Father    Stroke Maternal  Grandfather    Lung cancer Paternal Grandfather    Allergies  Allergen Reactions   Penicillins Hives and Itching    Has patient had a PCN reaction causing immediate rash, facial/tongue/throat swelling, SOB or lightheadedness with hypotension: no Has patient had a PCN reaction causing severe rash involving mucus membranes or skin necrosis: no Has patient had a PCN reaction that required hospitalization no Has patient had a PCN reaction occurring within the last 10 years: yes If all of the above answers are "NO", then may proceed with Cephalosporin use.    Bupropion Hcl Hives and Rash   Sulfa Antibiotics Rash      ROS Negative unless stated above    Objective:     BP 124/82 (BP Location: Left  Arm, Patient Position: Sitting, Cuff Size: Normal)   Pulse (!) 105   Temp 97.7 F (36.5 C) (Oral)   Ht 5\' 6"  (1.676 m)   Wt 219 lb 3.2 oz (99.4 kg)   LMP 03/12/2011   SpO2 97%   BMI 35.38 kg/m  BP Readings from Last 3 Encounters:  10/30/23 124/82  10/30/23 118/80  08/30/22 110/84   Wt Readings from Last 3 Encounters:  10/30/23 219 lb 3.2 oz (99.4 kg)  10/30/23 217 lb (98.4 kg)  08/30/22 152 lb 1.9 oz (69 kg)      Physical Exam Constitutional:      Appearance: Normal appearance.  HENT:     Head: Normocephalic and atraumatic.     Right Ear: Tympanic membrane, ear canal and external ear normal.     Left Ear: Tympanic membrane, ear canal and external ear normal.     Nose: Nose normal.     Mouth/Throat:     Mouth: Mucous membranes are moist.     Pharynx: No oropharyngeal exudate or posterior oropharyngeal erythema.  Eyes:     General: No scleral icterus.    Extraocular Movements: Extraocular movements intact.     Conjunctiva/sclera: Conjunctivae normal.     Pupils: Pupils are equal, round, and reactive to light.  Neck:     Thyroid: No thyromegaly.  Cardiovascular:     Rate and Rhythm: Normal rate and regular rhythm.     Pulses: Normal pulses.     Heart sounds: Normal heart sounds. No murmur heard.    No friction rub.  Pulmonary:     Effort: Pulmonary effort is normal.     Breath sounds: Normal breath sounds. No wheezing, rhonchi or rales.  Abdominal:     General: Bowel sounds are normal.     Palpations: Abdomen is soft.     Tenderness: There is no abdominal tenderness.  Musculoskeletal:        General: No deformity. Normal range of motion.  Lymphadenopathy:     Cervical: No cervical adenopathy.  Skin:    General: Skin is warm and dry.     Findings: No lesion.  Neurological:     General: No focal deficit present.     Mental Status: She is alert and oriented to person, place, and time.  Psychiatric:        Mood and Affect: Mood normal.        Thought Content:  Thought content normal.       10/30/2023    4:29 PM 02/10/2022    2:11 PM 01/03/2022    9:09 AM  Depression screen PHQ 2/9  Decreased Interest 0 0 1  Down, Depressed, Hopeless 0 0 0  PHQ - 2 Score 0 0  1  Altered sleeping 2 2 1   Tired, decreased energy 1 2 3   Change in appetite 0 1 1  Feeling bad or failure about yourself  0 0 0  Trouble concentrating 0 0 0  Moving slowly or fidgety/restless 0 0 2  Suicidal thoughts 0 0 0  PHQ-9 Score 3 5 8   Difficult doing work/chores Not difficult at all  Somewhat difficult      10/30/2023    4:29 PM 01/02/2021    9:47 AM  GAD 7 : Generalized Anxiety Score  Nervous, Anxious, on Edge 0 1  Control/stop worrying 0 0  Worry too much - different things 0 0  Trouble relaxing 1 2  Restless 0 0  Easily annoyed or irritable 1 1  Afraid - awful might happen 0 0  Total GAD 7 Score 2 4  Anxiety Difficulty Not difficult at all Not difficult at all    No results found for any visits on 10/30/23.    Assessment & Plan:  Well adult exam -     TSH; Future -     T4, free; Future -     Comprehensive metabolic panel; Future -     CBC with Differential/Platelet; Future -     Lipid panel; Future -     Hemoglobin A1c; Future -     VITAMIN D 25 Hydroxy (Vit-D Deficiency, Fractures); Future  Dry eye -     C-reactive protein; Future -     Lupus (SLE) Analysis; Future -     TSH; Future -     T4, free; Future -     Comprehensive metabolic panel; Future  Hair thinning -     TSH; Future -     T4, free; Future  Pain in joint involving multiple sites -     C-reactive protein; Future -     Lupus (SLE) Analysis; Future -     TSH; Future -     T4, free; Future -     CBC with Differential/Platelet; Future  Bilateral leg edema -     C-reactive protein; Future -     Lupus (SLE) Analysis; Future -     Comprehensive metabolic panel; Future -     CBC with Differential/Platelet; Future  Irritable bowel syndrome with both constipation and diarrhea -      Vitamin B12; Future -     TSH; Future -     T4, free; Future -     Comprehensive metabolic panel; Future -     CBC with Differential/Platelet; Future -     VITAMIN D 25 Hydroxy (Vit-D Deficiency, Fractures); Future   Age-appropriate health screenings discussed.  Obtain labs when patient is fasting.  Orders placed.  Mammogram done 02/24/2022.  Patient to schedule later this year.  Colonoscopy done at age 13.  Rescreen for autoimmune disorders.  If testing positive revisit rheumatology.  Recheck vitamin B12 given history of IBS.  Continue current medications.  Continue follow-up with GI. Return in about 4 months (around 02/29/2024), or If needed.    Deeann Saint, MD

## 2023-10-30 NOTE — Progress Notes (Signed)
 10/30/2023 Brandi Sutton [id number] 10/09/1975   HISTORY OF PRESENT ILLNESS: This is a 48 year old female who is a patient of Dr. Irving Burton.  She follows here for issues with IBS with alternating bowel habits but primarily constipation and episodes of diverticulitis.  Has had diverticulitis previously and documented on the CT scan in September 2023.  That was her last flare.  She called here 4 days ago, 3/17 with what she felt like was a flare of her diverticulitis.  Prescriptions for Cipro 500 mg twice daily and Flagyl 500 mg twice daily for 7 days were sent to her pharmacy.  She is about halfway through that course.  She is feeling better, but does still have some discomfort.  She previously had Levsin for her IBS, but says that she has not needed it and actually had run out.  Reports pain more central in the suprapubic region and says that it always hurts into her back.  Says that her bowel movements are never regular/normal, but have been better.   Last colonoscopy November 2017 with only diverticulosis noted, no polyps.  10-year recall recommended.  Past Medical History:  Diagnosis Date   Allergy    ASTHMA 03/31/2007   no attack in years per patient    Asthma    allergy related   Breast mass, left    CONTACT DERMATITIS&OTHER ECZEMA DUE TO PLANTS 12/03/2009   Depression    DYSFUNCTIONAL UTERINE BLEEDING 01/24/2010   GERD (gastroesophageal reflux disease)    Headache    sinus headaches    HIP PAIN, RIGHT 01/22/2009   LOW BACK PAIN, ACUTE 04/17/2008   Past Surgical History:  Procedure Laterality Date   ANKLE ARTHROSCOPY W/ INTERNAL FIXATION AND ILIAC CREST BONE GRAFT     BREAST BIOPSY Right    BREAST EXCISIONAL BIOPSY Left 2017   CHOLECYSTECTOMY N/A 12/25/2015   Procedure: LAPAROSCOPIC CHOLECYSTECTOMY;  Surgeon: Emelia Loron, MD;  Location: [facility];  Service: General;  Laterality: N/A;   LAPAROSCOPIC VAGINAL HYSTERECTOMY WITH SALPINGECTOMY Bilateral 10/14/2018   Procedure:  LAPAROSCOPIC ASSISTED VAGINAL HYSTERECTOMY WITH SALPINGECTOMY;  Surgeon: Levi Aland, MD;  Location: [facility];  Service: Gynecology;  Laterality: Bilateral;   RADIOACTIVE SEED GUIDED EXCISIONAL BREAST BIOPSY Left 07/14/2016   Procedure: RADIOACTIVE SEED GUIDED EXCISIONAL BREAST BIOPSY;  Surgeon: Emelia Loron, MD;  [facility];  Service: General;  Laterality: Left;   TONSILLECTOMY AND ADENOIDECTOMY     uterine abalation     WISDOM TOOTH EXTRACTION      reports that she quit smoking about 17 years ago. Her smoking use included cigarettes. She has never used smokeless tobacco. She reports current alcohol use. She reports that she does not use drugs. family history includes Bowel Disease in her father; Colon polyps in her mother; Depression in her mother; Diabetes in her father and mother; Hypertension in her father; Lung cancer in her paternal grandfather; Parkinson's disease in her father; Stroke in her maternal grandfather; Transient ischemic attack in her mother. Allergies  Allergen Reactions   Penicillins Hives and Itching    Has patient had a PCN reaction causing immediate rash, facial/tongue/throat swelling, SOB or lightheadedness with hypotension: no Has patient had a PCN reaction causing severe rash involving mucus membranes or skin necrosis: no Has patient had a PCN reaction that required hospitalization no Has patient had a PCN reaction occurring within the last 10 years: yes If all of the above answers are "NO", then may proceed with  Cephalosporin use.    Bupropion Hcl Hives and Rash   Sulfa Antibiotics Rash      Outpatient Encounter Medications as of 10/30/2023  Medication Sig   ciprofloxacin (CIPRO) 500 MG tablet Take 1 tablet (500 mg total) by mouth 2 (two) times daily for 7 days.   fluticasone (FLONASE) 50 MCG/ACT nasal spray Place into the nose.   metroNIDAZOLE (FLAGYL) 500 MG tablet Take 1 tablet (500 mg total) by mouth 2  (two) times daily for 7 days.   acetaminophen (TYLENOL) 500 MG tablet Take by mouth. (Patient not taking: Reported on 10/30/2023)   fexofenadine (ALLEGRA) 180 MG tablet TAKE 1 TABLET BY MOUTH EVERY DAY (Patient not taking: Reported on 10/30/2023)   furosemide (LASIX) 20 MG tablet Take 0.5 tablets (10 mg total) by mouth as needed (increased edema). (Patient not taking: Reported on 10/30/2023)   hyoscyamine (LEVSIN SL) 0.125 MG SL tablet Place 1 tablet (0.125 mg total) under the tongue every 6 (six) hours as needed. (Patient not taking: Reported on 10/30/2023)   ibuprofen (ADVIL) 100 MG tablet Take by mouth. (Patient not taking: Reported on 10/30/2023)   lansoprazole (PREVACID) 15 MG capsule Take by mouth. (Patient not taking: Reported on 10/30/2023)   Melatonin 2.5 MG CHEW Chew 2 each by mouth at bedtime. As needed- gummies for sleep (Patient not taking: Reported on 10/30/2023)   triamcinolone ointment (KENALOG) 0.5 % Apply 1 Application topically 2 (two) times daily. (Patient not taking: Reported on 10/30/2023)   No facility-administered encounter medications on file as of 10/30/2023.    REVIEW OF SYSTEMS  : All other systems reviewed and negative except where noted in the History of Present Illness.   PHYSICAL EXAM: Ht 5\' 6"  (1.676 m)   Wt 217 lb (98.4 kg)   LMP 03/12/2011   BMI 35.02 kg/m  General: Well developed white female in no acute distress Head: Normocephalic and atraumatic Eyes:  Sclerae anicteric, conjunctiva pink. Ears: Normal auditory acuity Lungs: Clear throughout to auscultation; no W/R/R. Heart: Regular rate and rhythm; no M/R/G.   Abdomen: Soft, non-distended.  BS present.  Suprapubic and left lower quadrant tenderness noted. Musculoskeletal: Symmetrical with no gross deformities  Skin: No lesions on visible extremities Neurological: Alert oriented x 4, grossly non-focal Psychological:  Alert and cooperative. Normal mood and affect  ASSESSMENT AND PLAN: *Diverticulitis:  Improving on Cipro and Flagyl.  Is about halfway through her course for a total of 7 days.  She will continue that.  Would continue with lots of fluids, soft diet for now until she is through this flare.  Will refill her Levsin for her to use for possibly associated spasm and cramping.  She had this previously for IBS, but had not used it in a long time and had actually run out.  Prescription sent to pharmacy.  Her last flare was a temper 2023.   CC:  Deeann Saint, MD

## 2023-11-02 ENCOUNTER — Telehealth: Payer: Self-pay | Admitting: Family Medicine

## 2023-11-02 ENCOUNTER — Other Ambulatory Visit: Payer: Self-pay

## 2023-11-02 DIAGNOSIS — K5732 Diverticulitis of large intestine without perforation or abscess without bleeding: Secondary | ICD-10-CM

## 2023-11-02 DIAGNOSIS — R1032 Left lower quadrant pain: Secondary | ICD-10-CM

## 2023-11-02 MED ORDER — CIPROFLOXACIN HCL 500 MG PO TABS: 500.0000 mg | ORAL_TABLET | Freq: Two times a day (BID) | ORAL | 0 refills | Status: DC

## 2023-11-02 MED ORDER — METRONIDAZOLE 500 MG PO TABS: 500.0000 mg | ORAL_TABLET | Freq: Two times a day (BID) | ORAL | 0 refills | Status: DC

## 2023-11-02 NOTE — Progress Notes (Signed)
 ____________________________________________________________  Attending physician addendum:  Thank you for sending this case to me. I have reviewed the entire note and agree with the plan.   Amada Jupiter, MD  ____________________________________________________________

## 2023-11-02 NOTE — Telephone Encounter (Signed)
 Spoke with patient resch

## 2023-11-02 NOTE — Telephone Encounter (Signed)
 Patient came in this morning 11/02/2023 at 7:18am for labs, stated appt was made Friday since she didn't fast, she needed to come back.  No orders are placed for this patient to have labs drawn, please advise at [phone].

## 2023-11-03 ENCOUNTER — Ambulatory Visit (HOSPITAL_COMMUNITY)
Admission: RE | Admit: 2023-11-03 | Discharge: 2023-11-03 | Disposition: A | Discharge status: Home / Self Care | Payer: PRIVATE HEALTH INSURANCE | Source: Ambulatory Visit | Attending: Gastroenterology | Admitting: Gastroenterology

## 2023-11-03 ENCOUNTER — Other Ambulatory Visit: Payer: PRIVATE HEALTH INSURANCE

## 2023-11-03 DIAGNOSIS — R1032 Left lower quadrant pain: Secondary | ICD-10-CM | POA: Insufficient documentation

## 2023-11-03 DIAGNOSIS — K5732 Diverticulitis of large intestine without perforation or abscess without bleeding: Secondary | ICD-10-CM | POA: Insufficient documentation

## 2023-11-03 MED ORDER — IOHEXOL 300 MG/ML  SOLN
100.0000 mL | Freq: Once | INTRAMUSCULAR | Status: AC | PRN
  Administered 2023-11-03: 100 mL via INTRAVENOUS

## 2023-11-03 MED ORDER — IOHEXOL 9 MG/ML PO SOLN
1000.0000 mL | Freq: Once | ORAL | Status: AC
  Administered 2023-11-03: 1000 mL via ORAL

## 2023-11-04 ENCOUNTER — Other Ambulatory Visit: Payer: Self-pay | Admitting: *Deleted

## 2023-11-04 MED ORDER — CIPROFLOXACIN HCL 500 MG PO TABS: 500.0000 mg | ORAL_TABLET | Freq: Two times a day (BID) | ORAL | 0 refills | Status: AC

## 2023-11-04 MED ORDER — METRONIDAZOLE 500 MG PO TABS: 500.0000 mg | ORAL_TABLET | Freq: Three times a day (TID) | ORAL | 0 refills | Status: AC

## 2023-11-05 ENCOUNTER — Other Ambulatory Visit (INDEPENDENT_AMBULATORY_CARE_PROVIDER_SITE_OTHER): Payer: PRIVATE HEALTH INSURANCE

## 2023-11-05 DIAGNOSIS — Z131 Encounter for screening for diabetes mellitus: Secondary | ICD-10-CM | POA: Diagnosis not present

## 2023-11-05 DIAGNOSIS — K582 Mixed irritable bowel syndrome: Secondary | ICD-10-CM

## 2023-11-05 DIAGNOSIS — R600 Localized edema: Secondary | ICD-10-CM | POA: Diagnosis not present

## 2023-11-05 DIAGNOSIS — Z13220 Encounter for screening for lipoid disorders: Secondary | ICD-10-CM | POA: Diagnosis not present

## 2023-11-05 DIAGNOSIS — M2550 Pain in unspecified joint: Secondary | ICD-10-CM | POA: Diagnosis not present

## 2023-11-05 DIAGNOSIS — Z0000 Encounter for general adult medical examination without abnormal findings: Secondary | ICD-10-CM | POA: Diagnosis not present

## 2023-11-05 DIAGNOSIS — L659 Nonscarring hair loss, unspecified: Secondary | ICD-10-CM | POA: Diagnosis not present

## 2023-11-05 DIAGNOSIS — H04129 Dry eye syndrome of unspecified lacrimal gland: Secondary | ICD-10-CM | POA: Diagnosis not present

## 2023-11-05 LAB — CBC WITH DIFFERENTIAL/PLATELET
Basophils Absolute: 0.1 10*3/uL (ref 0.0–0.1)
Basophils Relative: 1.1 % (ref 0.0–3.0)
Eosinophils Absolute: 0.1 10*3/uL (ref 0.0–0.7)
Eosinophils Relative: 3 % (ref 0.0–5.0)
HCT: 42.3 % (ref 36.0–46.0)
Hemoglobin: 14.2 g/dL (ref 12.0–15.0)
Lymphocytes Relative: 24.8 % (ref 12.0–46.0)
Lymphs Abs: 1.2 10*3/uL (ref 0.7–4.0)
MCHC: 33.5 g/dL (ref 30.0–36.0)
MCV: 92.3 fl (ref 78.0–100.0)
Monocytes Absolute: 0.3 10*3/uL (ref 0.1–1.0)
Monocytes Relative: 6.8 % (ref 3.0–12.0)
Neutro Abs: 3 10*3/uL (ref 1.4–7.7)
Neutrophils Relative %: 64.3 % (ref 43.0–77.0)
Platelets: 310 10*3/uL (ref 150.0–400.0)
RBC: 4.59 Mil/uL (ref 3.87–5.11)
RDW: 13.6 % (ref 11.5–15.5)
WBC: 4.7 10*3/uL (ref 4.0–10.5)

## 2023-11-05 LAB — COMPREHENSIVE METABOLIC PANEL WITH GFR
ALT: 28 U/L (ref 0–35)
AST: 24 U/L (ref 0–37)
Albumin: 4.1 g/dL (ref 3.5–5.2)
Alkaline Phosphatase: 68 U/L (ref 39–117)
BUN: 8 mg/dL (ref 6–23)
CO2: 27 meq/L (ref 19–32)
Calcium: 9.2 mg/dL (ref 8.4–10.5)
Chloride: 104 meq/L (ref 96–112)
Creatinine, Ser: 0.81 mg/dL (ref 0.40–1.20)
GFR: 84.75 mL/min (ref >60.00)
Glucose, Bld: 93 mg/dL (ref 70–99)
Potassium: 3.9 meq/L (ref 3.5–5.1)
Sodium: 139 meq/L (ref 135–145)
Total Bilirubin: 0.5 mg/dL (ref 0.2–1.2)
Total Protein: 6.7 g/dL (ref 6.0–8.3)

## 2023-11-05 LAB — LIPID PANEL
Cholesterol: 146 mg/dL (ref 0–200)
HDL: 52 mg/dL (ref >39.00)
LDL Cholesterol: 81 mg/dL (ref 0–99)
NonHDL: 94.01
Total CHOL/HDL Ratio: 3
Triglycerides: 65 mg/dL (ref 0.0–149.0)
VLDL: 13 mg/dL (ref 0.0–40.0)

## 2023-11-05 LAB — C-REACTIVE PROTEIN: CRP: 1.1 mg/dL (ref 0.5–20.0)

## 2023-11-05 LAB — HEMOGLOBIN A1C: Hgb A1c MFr Bld: 5.3 % (ref 4.6–6.5)

## 2023-11-06 LAB — VITAMIN D 25 HYDROXY (VIT D DEFICIENCY, FRACTURES): VITD: 14.25 ng/mL — ABNORMAL LOW (ref 30.00–100.00)

## 2023-11-06 LAB — T4, FREE: Free T4: 0.93 ng/dL (ref 0.60–1.60)

## 2023-11-06 LAB — VITAMIN B12: Vitamin B-12: 303 pg/mL (ref 211–911)

## 2023-11-06 LAB — TSH: TSH: 1.87 u[IU]/mL (ref 0.35–5.50)

## 2023-11-09 ENCOUNTER — Encounter: Payer: Self-pay | Admitting: Family Medicine

## 2023-11-10 LAB — LUPUS (SLE) ANALYSIS
Complement C4, Serum: 34 mg/dL (ref 12–38)
ENA SSA (RO) Ab: 0.2 AI (ref 0.0–0.9)
Parietal Cell Ab: 40.8 U — ABNORMAL HIGH (ref 0.0–20.0)
Smooth Muscle Ab: 5 U (ref 0–19)
Thyroperoxidase Ab SerPl-aCnc: 17 [IU]/mL (ref 0–34)
dsDNA Ab: 1 [IU]/mL (ref 0–9)

## 2023-11-11 MED ORDER — VITAMIN D (ERGOCALCIFEROL) 1.25 MG (50000 UNIT) PO CAPS: 50000.0000 [IU] | ORAL_CAPSULE | ORAL | 0 refills | Status: AC

## 2024-02-26 ENCOUNTER — Encounter: Payer: Self-pay | Admitting: Advanced Practice Midwife
